# Patient Record
Sex: Male | Born: 1965 | Race: Black or African American | Hispanic: No | Marital: Married | Smoking: Never smoker
Health system: Southern US, Community
[De-identification: ages and names within clinical notes are randomized; demographics above are authoritative.]

## PROBLEM LIST (undated history)

## (undated) DIAGNOSIS — D709 Neutropenia, unspecified: Secondary | ICD-10-CM

## (undated) DIAGNOSIS — D473 Essential (hemorrhagic) thrombocythemia: Secondary | ICD-10-CM

## (undated) DIAGNOSIS — R768 Other specified abnormal immunological findings in serum: Secondary | ICD-10-CM

## (undated) DIAGNOSIS — I10 Essential (primary) hypertension: Secondary | ICD-10-CM

## (undated) DIAGNOSIS — K219 Gastro-esophageal reflux disease without esophagitis: Secondary | ICD-10-CM

## (undated) DIAGNOSIS — D509 Iron deficiency anemia, unspecified: Secondary | ICD-10-CM

## (undated) HISTORY — DX: Neutropenia, unspecified: D70.9

## (undated) HISTORY — DX: Other specified abnormal immunological findings in serum: R76.8

## (undated) HISTORY — DX: Iron deficiency anemia, unspecified: D50.9

## (undated) HISTORY — PX: HIATAL HERNIA REPAIR: SHX195

## (undated) HISTORY — DX: Essential (hemorrhagic) thrombocythemia: D47.3

---
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
## 2003-01-20 ENCOUNTER — Encounter: Payer: Self-pay | Admitting: Specialist

## 2003-01-20 ENCOUNTER — Encounter: Admission: RE | Admit: 2003-01-20 | Discharge: 2003-01-20 | Payer: Self-pay | Admitting: Specialist

## 2003-07-17 ENCOUNTER — Encounter: Admission: RE | Admit: 2003-07-17 | Discharge: 2003-07-17 | Payer: Self-pay | Admitting: Specialist

## 2003-07-17 ENCOUNTER — Encounter: Payer: Self-pay | Admitting: Specialist

## 2004-07-25 ENCOUNTER — Encounter: Admission: RE | Admit: 2004-07-25 | Discharge: 2004-07-25 | Payer: Self-pay | Admitting: Specialist

## 2004-08-31 ENCOUNTER — Ambulatory Visit: Payer: Self-pay | Admitting: Internal Medicine

## 2004-10-01 ENCOUNTER — Ambulatory Visit: Payer: Self-pay | Admitting: Internal Medicine

## 2004-10-31 ENCOUNTER — Ambulatory Visit: Payer: Self-pay | Admitting: Internal Medicine

## 2004-12-01 ENCOUNTER — Ambulatory Visit: Payer: Self-pay | Admitting: Internal Medicine

## 2005-01-01 ENCOUNTER — Ambulatory Visit: Payer: Self-pay | Admitting: Internal Medicine

## 2005-01-29 ENCOUNTER — Ambulatory Visit: Payer: Self-pay | Admitting: Internal Medicine

## 2005-03-01 ENCOUNTER — Ambulatory Visit: Payer: Self-pay | Admitting: Internal Medicine

## 2005-03-31 ENCOUNTER — Ambulatory Visit: Payer: Self-pay | Admitting: Internal Medicine

## 2005-07-02 ENCOUNTER — Ambulatory Visit: Payer: Self-pay | Admitting: Internal Medicine

## 2005-08-01 ENCOUNTER — Ambulatory Visit: Payer: Self-pay | Admitting: Internal Medicine

## 2005-09-19 ENCOUNTER — Ambulatory Visit: Payer: Self-pay | Admitting: Internal Medicine

## 2005-10-01 ENCOUNTER — Ambulatory Visit: Payer: Self-pay | Admitting: Internal Medicine

## 2005-12-18 ENCOUNTER — Ambulatory Visit: Payer: Self-pay | Admitting: Internal Medicine

## 2006-01-01 ENCOUNTER — Ambulatory Visit: Payer: Self-pay | Admitting: Internal Medicine

## 2006-01-29 ENCOUNTER — Ambulatory Visit: Payer: Self-pay | Admitting: Internal Medicine

## 2006-03-01 ENCOUNTER — Ambulatory Visit: Payer: Self-pay | Admitting: Internal Medicine

## 2006-03-31 ENCOUNTER — Ambulatory Visit: Payer: Self-pay | Admitting: Internal Medicine

## 2006-07-01 ENCOUNTER — Ambulatory Visit: Payer: Self-pay | Admitting: Internal Medicine

## 2006-08-01 ENCOUNTER — Ambulatory Visit: Payer: Self-pay | Admitting: Internal Medicine

## 2006-11-26 ENCOUNTER — Ambulatory Visit: Payer: Self-pay | Admitting: Internal Medicine

## 2006-12-01 ENCOUNTER — Ambulatory Visit: Payer: Self-pay | Admitting: Internal Medicine

## 2007-01-21 ENCOUNTER — Ambulatory Visit: Payer: Self-pay | Admitting: Internal Medicine

## 2007-01-30 ENCOUNTER — Ambulatory Visit: Payer: Self-pay | Admitting: Internal Medicine

## 2007-04-16 ENCOUNTER — Ambulatory Visit: Payer: Self-pay | Admitting: Internal Medicine

## 2007-05-02 ENCOUNTER — Ambulatory Visit: Payer: Self-pay | Admitting: Internal Medicine

## 2007-06-01 ENCOUNTER — Ambulatory Visit: Payer: Self-pay | Admitting: Internal Medicine

## 2007-07-02 ENCOUNTER — Ambulatory Visit: Payer: Self-pay | Admitting: Internal Medicine

## 2007-07-27 ENCOUNTER — Ambulatory Visit: Payer: Self-pay | Admitting: Internal Medicine

## 2007-08-02 ENCOUNTER — Ambulatory Visit: Payer: Self-pay | Admitting: Internal Medicine

## 2007-09-01 ENCOUNTER — Ambulatory Visit: Payer: Self-pay | Admitting: Internal Medicine

## 2007-09-09 ENCOUNTER — Ambulatory Visit: Payer: Self-pay | Admitting: Internal Medicine

## 2007-10-02 ENCOUNTER — Ambulatory Visit: Payer: Self-pay | Admitting: Internal Medicine

## 2007-11-01 ENCOUNTER — Ambulatory Visit: Payer: Self-pay | Admitting: Internal Medicine

## 2007-11-01 HISTORY — PX: UPPER GI ENDOSCOPY: SHX6162

## 2007-11-24 ENCOUNTER — Ambulatory Visit: Payer: Self-pay | Admitting: Gastroenterology

## 2008-01-11 ENCOUNTER — Ambulatory Visit: Payer: Self-pay | Admitting: Internal Medicine

## 2008-01-30 ENCOUNTER — Ambulatory Visit: Payer: Self-pay | Admitting: Internal Medicine

## 2008-03-01 ENCOUNTER — Ambulatory Visit: Payer: Self-pay | Admitting: Internal Medicine

## 2008-04-28 ENCOUNTER — Ambulatory Visit: Payer: Self-pay | Admitting: Internal Medicine

## 2008-05-01 ENCOUNTER — Ambulatory Visit: Payer: Self-pay | Admitting: Internal Medicine

## 2008-07-01 ENCOUNTER — Ambulatory Visit: Payer: Self-pay | Admitting: Internal Medicine

## 2008-08-31 ENCOUNTER — Ambulatory Visit: Payer: Self-pay | Admitting: Internal Medicine

## 2008-09-01 ENCOUNTER — Ambulatory Visit: Payer: Self-pay | Admitting: Internal Medicine

## 2008-10-01 ENCOUNTER — Ambulatory Visit: Payer: Self-pay | Admitting: Internal Medicine

## 2008-10-31 ENCOUNTER — Ambulatory Visit: Payer: Self-pay | Admitting: Internal Medicine

## 2008-12-01 ENCOUNTER — Ambulatory Visit: Payer: Self-pay | Admitting: Internal Medicine

## 2009-01-18 ENCOUNTER — Ambulatory Visit: Payer: Self-pay | Admitting: Internal Medicine

## 2009-02-01 ENCOUNTER — Ambulatory Visit: Payer: Self-pay | Admitting: Internal Medicine

## 2009-03-01 ENCOUNTER — Ambulatory Visit: Payer: Self-pay | Admitting: Internal Medicine

## 2009-03-31 ENCOUNTER — Ambulatory Visit: Payer: Self-pay | Admitting: Internal Medicine

## 2009-05-01 ENCOUNTER — Ambulatory Visit: Payer: Self-pay | Admitting: Internal Medicine

## 2009-06-07 ENCOUNTER — Ambulatory Visit: Payer: Self-pay | Admitting: Internal Medicine

## 2009-07-01 ENCOUNTER — Ambulatory Visit: Payer: Self-pay | Admitting: Internal Medicine

## 2009-08-31 ENCOUNTER — Ambulatory Visit: Payer: Self-pay | Admitting: Internal Medicine

## 2009-10-18 ENCOUNTER — Ambulatory Visit: Payer: Self-pay | Admitting: Internal Medicine

## 2009-10-31 ENCOUNTER — Ambulatory Visit: Payer: Self-pay | Admitting: Internal Medicine

## 2009-12-01 ENCOUNTER — Ambulatory Visit: Payer: Self-pay | Admitting: Internal Medicine

## 2009-12-06 ENCOUNTER — Ambulatory Visit: Payer: Self-pay | Admitting: Internal Medicine

## 2010-01-01 ENCOUNTER — Ambulatory Visit: Payer: Self-pay | Admitting: Internal Medicine

## 2010-01-18 ENCOUNTER — Ambulatory Visit: Payer: Self-pay | Admitting: Internal Medicine

## 2010-01-29 ENCOUNTER — Ambulatory Visit: Payer: Self-pay | Admitting: Internal Medicine

## 2010-03-01 ENCOUNTER — Ambulatory Visit: Payer: Self-pay | Admitting: Internal Medicine

## 2010-07-22 ENCOUNTER — Ambulatory Visit: Payer: Self-pay | Admitting: Internal Medicine

## 2010-08-01 ENCOUNTER — Ambulatory Visit: Payer: Self-pay | Admitting: Internal Medicine

## 2010-08-31 ENCOUNTER — Ambulatory Visit: Payer: Self-pay | Admitting: Internal Medicine

## 2010-10-18 ENCOUNTER — Ambulatory Visit: Payer: Self-pay | Admitting: Internal Medicine

## 2010-10-31 ENCOUNTER — Ambulatory Visit: Payer: Self-pay | Admitting: Internal Medicine

## 2010-12-26 ENCOUNTER — Ambulatory Visit: Payer: Self-pay | Admitting: Internal Medicine

## 2011-01-01 ENCOUNTER — Ambulatory Visit: Payer: Self-pay | Admitting: Internal Medicine

## 2011-01-30 ENCOUNTER — Ambulatory Visit: Payer: Self-pay | Admitting: Internal Medicine

## 2011-06-18 ENCOUNTER — Ambulatory Visit: Payer: Self-pay | Admitting: Internal Medicine

## 2011-07-02 ENCOUNTER — Ambulatory Visit: Payer: Self-pay | Admitting: Internal Medicine

## 2011-12-22 ENCOUNTER — Ambulatory Visit: Payer: Self-pay | Admitting: Internal Medicine

## 2011-12-22 LAB — CBC CANCER CENTER
Basophil %: 1.3 %
Eosinophil #: 0.2 x10 3/mm (ref 0.0–0.7)
Eosinophil %: 5.6 %
HCT: 43.2 % (ref 40.0–52.0)
HGB: 14.3 g/dL (ref 13.0–18.0)
Lymphocyte #: 1.4 x10 3/mm (ref 1.0–3.6)
Lymphocyte %: 33.3 %
MCH: 27.2 pg (ref 26.0–34.0)
MCV: 82 fL (ref 80–100)
Monocyte %: 9.9 %
Neutrophil #: 2.1 x10 3/mm (ref 1.4–6.5)
Neutrophil %: 49.9 %
Platelet: 449 x10 3/mm — ABNORMAL HIGH (ref 150–440)
RBC: 5.25 10*6/uL (ref 4.40–5.90)
RDW: 15.3 % — ABNORMAL HIGH (ref 11.5–14.5)
WBC: 4.2 x10 3/mm (ref 3.8–10.6)

## 2012-01-02 ENCOUNTER — Ambulatory Visit: Payer: Self-pay | Admitting: Internal Medicine

## 2012-03-29 ENCOUNTER — Ambulatory Visit: Payer: Self-pay | Admitting: Internal Medicine

## 2012-03-29 LAB — CBC CANCER CENTER
Basophil #: 0.1 x10 3/mm (ref 0.0–0.1)
Basophil %: 1.3 %
Eosinophil #: 0.2 x10 3/mm (ref 0.0–0.7)
HCT: 43.1 % (ref 40.0–52.0)
HGB: 13.7 g/dL (ref 13.0–18.0)
Lymphocyte %: 36.5 %
MCH: 26.7 pg (ref 26.0–34.0)
MCHC: 31.7 g/dL — ABNORMAL LOW (ref 32.0–36.0)
MCV: 84 fL (ref 80–100)
Monocyte #: 0.5 x10 3/mm (ref 0.2–1.0)
Monocyte %: 12.6 %
Neutrophil #: 1.8 x10 3/mm (ref 1.4–6.5)
Neutrophil %: 44.8 %
RBC: 5.12 10*6/uL (ref 4.40–5.90)
RDW: 15.3 % — ABNORMAL HIGH (ref 11.5–14.5)
WBC: 4 x10 3/mm (ref 3.8–10.6)

## 2012-03-31 ENCOUNTER — Ambulatory Visit: Payer: Self-pay | Admitting: Internal Medicine

## 2012-09-21 ENCOUNTER — Ambulatory Visit: Payer: Self-pay | Admitting: Internal Medicine

## 2012-09-21 LAB — CBC CANCER CENTER
Basophil #: 0 x10 3/mm (ref 0.0–0.1)
Basophil %: 0.1 %
Eosinophil #: 0.2 x10 3/mm (ref 0.0–0.7)
Eosinophil %: 4.8 %
HCT: 42.9 % (ref 40.0–52.0)
Lymphocyte #: 1.5 x10 3/mm (ref 1.0–3.6)
Lymphocyte %: 39.6 %
MCH: 27.6 pg (ref 26.0–34.0)
MCHC: 32.5 g/dL (ref 32.0–36.0)
MCV: 85 fL (ref 80–100)
Monocyte #: 0.5 x10 3/mm (ref 0.2–1.0)
Monocyte %: 13.5 %
Neutrophil #: 1.6 x10 3/mm (ref 1.4–6.5)
Neutrophil %: 42 %
Platelet: 329 x10 3/mm (ref 150–440)
RBC: 5.04 10*6/uL (ref 4.40–5.90)
RDW: 14.8 % — ABNORMAL HIGH (ref 11.5–14.5)
WBC: 3.9 x10 3/mm (ref 3.8–10.6)

## 2012-10-01 ENCOUNTER — Ambulatory Visit: Payer: Self-pay | Admitting: Internal Medicine

## 2012-10-25 LAB — CBC CANCER CENTER
Basophil #: 0 x10 3/mm (ref 0.0–0.1)
Basophil %: 0.5 %
Eosinophil #: 0.1 x10 3/mm (ref 0.0–0.7)
Eosinophil %: 3.1 %
HCT: 44.9 % (ref 40.0–52.0)
HGB: 14 g/dL (ref 13.0–18.0)
Lymphocyte #: 1.5 x10 3/mm (ref 1.0–3.6)
Lymphocyte %: 30.9 %
MCH: 26.5 pg (ref 26.0–34.0)
MCV: 85 fL (ref 80–100)
Monocyte #: 0.6 x10 3/mm (ref 0.2–1.0)
Monocyte %: 11.5 %
Neutrophil #: 2.6 x10 3/mm (ref 1.4–6.5)
Neutrophil %: 54 %
Platelet: 346 x10 3/mm (ref 150–440)
RBC: 5.29 10*6/uL (ref 4.40–5.90)
RDW: 14.8 % — ABNORMAL HIGH (ref 11.5–14.5)
WBC: 4.8 x10 3/mm (ref 3.8–10.6)

## 2012-10-31 ENCOUNTER — Ambulatory Visit: Payer: Self-pay | Admitting: Internal Medicine

## 2012-12-01 ENCOUNTER — Ambulatory Visit: Payer: Self-pay | Admitting: Internal Medicine

## 2013-03-17 ENCOUNTER — Ambulatory Visit: Payer: Self-pay | Admitting: Internal Medicine

## 2013-03-24 LAB — CBC CANCER CENTER
Basophil #: 0 x10 3/mm (ref 0.0–0.1)
Basophil %: 0.2 %
Eosinophil #: 0.2 x10 3/mm (ref 0.0–0.7)
Eosinophil %: 4.7 %
HCT: 42.4 % (ref 40.0–52.0)
HGB: 13.7 g/dL (ref 13.0–18.0)
Lymphocyte #: 1.4 x10 3/mm (ref 1.0–3.6)
Lymphocyte %: 38.6 %
MCH: 26.5 pg (ref 26.0–34.0)
MCHC: 32.3 g/dL (ref 32.0–36.0)
MCV: 82 fL (ref 80–100)
Monocyte #: 0.5 x10 3/mm (ref 0.2–1.0)
Neutrophil #: 1.6 x10 3/mm (ref 1.4–6.5)
Neutrophil %: 43.3 %
Platelet: 400 x10 3/mm (ref 150–440)
RBC: 5.16 10*6/uL (ref 4.40–5.90)
WBC: 3.6 x10 3/mm — ABNORMAL LOW (ref 3.8–10.6)

## 2013-03-31 ENCOUNTER — Ambulatory Visit: Payer: Self-pay | Admitting: Internal Medicine

## 2013-03-31 HISTORY — PX: COLONOSCOPY: SHX174

## 2013-04-01 ENCOUNTER — Ambulatory Visit: Payer: Self-pay | Admitting: Gastroenterology

## 2013-04-05 LAB — CBC CANCER CENTER
Basophil #: 0.1 x10 3/mm (ref 0.0–0.1)
Eosinophil %: 3.3 %
HCT: 38.4 % — ABNORMAL LOW (ref 40.0–52.0)
Lymphocyte #: 1.7 x10 3/mm (ref 1.0–3.6)
MCH: 26.5 pg (ref 26.0–34.0)
Monocyte %: 10.1 %
Neutrophil #: 2.7 x10 3/mm (ref 1.4–6.5)
Platelet: 630 x10 3/mm — ABNORMAL HIGH (ref 150–440)
RBC: 4.72 10*6/uL (ref 4.40–5.90)
WBC: 5.1 x10 3/mm (ref 3.8–10.6)

## 2013-05-01 ENCOUNTER — Ambulatory Visit: Payer: Self-pay | Admitting: Internal Medicine

## 2013-06-16 ENCOUNTER — Ambulatory Visit: Payer: Self-pay | Admitting: Internal Medicine

## 2014-01-12 ENCOUNTER — Ambulatory Visit: Payer: Self-pay | Admitting: Internal Medicine

## 2014-01-13 LAB — CBC CANCER CENTER
BASOS ABS: 0.1 x10 3/mm (ref 0.0–0.1)
BASOS PCT: 1.5 %
Eosinophil #: 0.2 x10 3/mm (ref 0.0–0.7)
Eosinophil %: 4.9 %
HCT: 42 % (ref 40.0–52.0)
HGB: 13.3 g/dL (ref 13.0–18.0)
LYMPHS ABS: 1.6 x10 3/mm (ref 1.0–3.6)
LYMPHS PCT: 33 %
MCH: 26.2 pg (ref 26.0–34.0)
MCHC: 31.7 g/dL — ABNORMAL LOW (ref 32.0–36.0)
MCV: 83 fL (ref 80–100)
Monocyte #: 0.5 x10 3/mm (ref 0.2–1.0)
Monocyte %: 10.9 %
NEUTROS ABS: 2.5 x10 3/mm (ref 1.4–6.5)
Neutrophil %: 49.7 %
Platelet: 306 x10 3/mm (ref 150–440)
RBC: 5.1 10*6/uL (ref 4.40–5.90)
RDW: 14.9 % — ABNORMAL HIGH (ref 11.5–14.5)
WBC: 5 x10 3/mm (ref 3.8–10.6)

## 2014-01-29 ENCOUNTER — Ambulatory Visit: Payer: Self-pay | Admitting: Internal Medicine

## 2014-04-12 ENCOUNTER — Ambulatory Visit: Payer: Self-pay | Admitting: Internal Medicine

## 2014-04-12 LAB — CBC CANCER CENTER
BASOS PCT: 2.1 %
Basophil #: 0.1 x10 3/mm (ref 0.0–0.1)
EOS ABS: 0.3 x10 3/mm (ref 0.0–0.7)
Eosinophil %: 6.3 %
HCT: 41.3 % (ref 40.0–52.0)
HGB: 13.4 g/dL (ref 13.0–18.0)
LYMPHS ABS: 1.6 x10 3/mm (ref 1.0–3.6)
Lymphocyte %: 34.5 %
MCH: 26.8 pg (ref 26.0–34.0)
MCHC: 32.6 g/dL (ref 32.0–36.0)
MCV: 82 fL (ref 80–100)
MONOS PCT: 13.6 %
Monocyte #: 0.6 x10 3/mm (ref 0.2–1.0)
NEUTROS ABS: 2 x10 3/mm (ref 1.4–6.5)
NEUTROS PCT: 43.5 %
PLATELETS: 236 x10 3/mm (ref 150–440)
RBC: 5.02 10*6/uL (ref 4.40–5.90)
RDW: 14.8 % — ABNORMAL HIGH (ref 11.5–14.5)
WBC: 4.6 x10 3/mm (ref 3.8–10.6)

## 2014-05-01 ENCOUNTER — Ambulatory Visit: Payer: Self-pay | Admitting: Internal Medicine

## 2014-10-13 ENCOUNTER — Ambulatory Visit: Payer: Self-pay | Admitting: Internal Medicine

## 2014-10-13 LAB — CBC CANCER CENTER
BASOS ABS: 0 x10 3/mm (ref 0.0–0.1)
BASOS PCT: 0.5 %
EOS ABS: 0.2 x10 3/mm (ref 0.0–0.7)
Eosinophil %: 4.3 %
HCT: 39.4 % — ABNORMAL LOW (ref 40.0–52.0)
HGB: 12.6 g/dL — ABNORMAL LOW (ref 13.0–18.0)
LYMPHS ABS: 1.8 x10 3/mm (ref 1.0–3.6)
LYMPHS PCT: 41.3 %
MCH: 26.9 pg (ref 26.0–34.0)
MCHC: 32.1 g/dL (ref 32.0–36.0)
MCV: 84 fL (ref 80–100)
Monocyte #: 0.5 x10 3/mm (ref 0.2–1.0)
Monocyte %: 11 %
NEUTROS PCT: 42.9 %
Neutrophil #: 1.8 x10 3/mm (ref 1.4–6.5)
Platelet: 374 x10 3/mm (ref 150–440)
RBC: 4.69 10*6/uL (ref 4.40–5.90)
RDW: 14.3 % (ref 11.5–14.5)
WBC: 4.3 x10 3/mm (ref 3.8–10.6)

## 2014-10-31 ENCOUNTER — Ambulatory Visit: Payer: Self-pay | Admitting: Internal Medicine

## 2014-11-01 LAB — RETICULOCYTES
Absolute Retic Count: 0.0428 10*6/uL (ref 0.019–0.186)
RETICULOCYTE: 0.83 % (ref 0.4–3.1)

## 2014-11-01 LAB — CBC CANCER CENTER
BASOS ABS: 0.1 x10 3/mm (ref 0.0–0.1)
BASOS PCT: 1.3 %
EOS PCT: 4.1 %
Eosinophil #: 0.2 x10 3/mm (ref 0.0–0.7)
HCT: 42.7 % (ref 40.0–52.0)
HGB: 13.5 g/dL (ref 13.0–18.0)
Lymphocyte #: 1.7 x10 3/mm (ref 1.0–3.6)
Lymphocyte %: 37.2 %
MCH: 26.5 pg (ref 26.0–34.0)
MCHC: 31.5 g/dL — AB (ref 32.0–36.0)
MCV: 84 fL (ref 80–100)
MONOS PCT: 9.7 %
Monocyte #: 0.4 x10 3/mm (ref 0.2–1.0)
NEUTROS PCT: 47.7 %
Neutrophil #: 2.1 x10 3/mm (ref 1.4–6.5)
Platelet: 328 x10 3/mm (ref 150–440)
RBC: 5.09 10*6/uL (ref 4.40–5.90)
RDW: 14.7 % — AB (ref 11.5–14.5)
WBC: 4.5 x10 3/mm (ref 3.8–10.6)

## 2014-11-01 LAB — CREATININE, SERUM
Creatinine: 0.93 mg/dL (ref 0.60–1.30)
EGFR (African American): >60
EGFR (Non-African Amer.): >60

## 2014-11-01 LAB — IRON AND TIBC
Iron Bind.Cap.(Total): 330 ug/dL (ref 250–450)
Iron Saturation: 21 %
Iron: 70 ug/dL (ref 65–175)
Unbound Iron-Bind.Cap.: 260 ug/dL

## 2014-11-01 LAB — FERRITIN: Ferritin (ARMC): 44 ng/mL (ref 8–388)

## 2014-12-01 ENCOUNTER — Ambulatory Visit: Payer: Self-pay | Admitting: Internal Medicine

## 2015-01-31 ENCOUNTER — Ambulatory Visit
Admit: 2015-01-31 | Disposition: A | Discharge status: Home / Self Care | Payer: Self-pay | Attending: Internal Medicine | Admitting: Internal Medicine

## 2015-03-02 ENCOUNTER — Ambulatory Visit
Admit: 2015-03-02 | Disposition: A | Discharge status: Home / Self Care | Payer: Self-pay | Attending: Internal Medicine | Admitting: Internal Medicine

## 2015-04-24 ENCOUNTER — Other Ambulatory Visit: Payer: Self-pay | Admitting: *Deleted

## 2015-04-24 DIAGNOSIS — D473 Essential (hemorrhagic) thrombocythemia: Secondary | ICD-10-CM

## 2015-04-25 ENCOUNTER — Other Ambulatory Visit: Payer: Self-pay

## 2015-05-08 ENCOUNTER — Inpatient Hospital Stay: Payer: 59 | Attending: Family Medicine

## 2015-05-08 ENCOUNTER — Encounter (INDEPENDENT_AMBULATORY_CARE_PROVIDER_SITE_OTHER): Payer: Self-pay

## 2015-05-08 DIAGNOSIS — D473 Essential (hemorrhagic) thrombocythemia: Secondary | ICD-10-CM | POA: Diagnosis not present

## 2015-05-08 LAB — CBC WITH DIFFERENTIAL/PLATELET
Basophils Absolute: 0.1 10*3/uL (ref 0–0.1)
Basophils Relative: 1 %
EOS ABS: 0.2 10*3/uL (ref 0–0.7)
Eosinophils Relative: 5 %
HCT: 41.9 % (ref 40.0–52.0)
HEMOGLOBIN: 13.5 g/dL (ref 13.0–18.0)
Lymphocytes Relative: 32 %
Lymphs Abs: 1.5 10*3/uL (ref 1.0–3.6)
MCH: 26.5 pg (ref 26.0–34.0)
MCHC: 32.3 g/dL (ref 32.0–36.0)
MCV: 82.1 fL (ref 80.0–100.0)
Monocytes Absolute: 0.6 10*3/uL (ref 0.2–1.0)
Monocytes Relative: 12 %
NEUTROS ABS: 2.3 10*3/uL (ref 1.4–6.5)
Neutrophils Relative %: 50 %
Platelets: 573 10*3/uL — ABNORMAL HIGH (ref 150–440)
RBC: 5.11 MIL/uL (ref 4.40–5.90)
RDW: 15 % — ABNORMAL HIGH (ref 11.5–14.5)
WBC: 4.6 10*3/uL (ref 3.8–10.6)

## 2015-07-18 ENCOUNTER — Inpatient Hospital Stay: Payer: 59

## 2015-07-18 ENCOUNTER — Inpatient Hospital Stay: Payer: 59 | Attending: Internal Medicine

## 2015-07-18 DIAGNOSIS — D473 Essential (hemorrhagic) thrombocythemia: Secondary | ICD-10-CM

## 2015-07-18 LAB — CBC WITH DIFFERENTIAL/PLATELET
Basophils Absolute: 0.3 10*3/uL — ABNORMAL HIGH (ref 0–0.1)
Basophils Relative: 6 %
Eosinophils Absolute: 0.2 10*3/uL (ref 0–0.7)
Eosinophils Relative: 4 %
HEMATOCRIT: 41.7 % (ref 40.0–52.0)
HEMOGLOBIN: 13.6 g/dL (ref 13.0–18.0)
LYMPHS PCT: 28 %
Lymphs Abs: 1.3 10*3/uL (ref 1.0–3.6)
MCH: 26.8 pg (ref 26.0–34.0)
MCHC: 32.5 g/dL (ref 32.0–36.0)
MCV: 82.3 fL (ref 80.0–100.0)
MONO ABS: 0.6 10*3/uL (ref 0.2–1.0)
MONOS PCT: 13 %
NEUTROS ABS: 2.3 10*3/uL (ref 1.4–6.5)
NEUTROS PCT: 49 %
Platelets: 351 10*3/uL (ref 150–440)
RBC: 5.07 MIL/uL (ref 4.40–5.90)
RDW: 15.3 % — AB (ref 11.5–14.5)
WBC: 4.6 10*3/uL (ref 3.8–10.6)

## 2015-09-20 ENCOUNTER — Other Ambulatory Visit: Payer: Self-pay | Admitting: *Deleted

## 2015-09-20 MED ORDER — ANAGRELIDE HCL 0.5 MG PO CAPS: ORAL_CAPSULE | ORAL | Status: DC

## 2015-09-20 NOTE — Telephone Encounter (Signed)
He was last seen by Dr Inez Pilgrim 11/01/2014 and has an appt with Magda Paganini on 11/9, but needs a refill on his agrylin

## 2015-09-20 NOTE — Telephone Encounter (Signed)
Spoke with patient regarding refill being sent to cover him until his appt on the 9th and that he would be seeing a new md. He was fine with that and asked why only enough til appt and I told him may want to make some adjustments

## 2015-10-04 ENCOUNTER — Encounter: Payer: Self-pay | Admitting: *Deleted

## 2015-10-04 ENCOUNTER — Telehealth: Payer: Self-pay | Admitting: *Deleted

## 2015-10-04 NOTE — Telephone Encounter (Signed)
Wants to get his labs drawn at [facility] and will need an order form for that. He has LAB / MD appt on 11/9 and said he plans on coming ot see md at that time, so he needs form before then to get labs drawn. Please call him when for m ready to pick up

## 2015-10-05 ENCOUNTER — Encounter: Payer: Self-pay | Admitting: Internal Medicine

## 2015-10-05 NOTE — Telephone Encounter (Signed)
Patient notified 11/3. rx for labs ready to be picked up.

## 2015-10-10 ENCOUNTER — Inpatient Hospital Stay: Payer: 59 | Attending: Internal Medicine | Admitting: Internal Medicine

## 2015-10-10 ENCOUNTER — Encounter: Payer: Self-pay | Admitting: Internal Medicine

## 2015-10-10 ENCOUNTER — Other Ambulatory Visit: Payer: Self-pay

## 2015-10-10 VITALS — BP 140/90 | HR 71 | Temp 98.4°F | Resp 18 | Ht 66.0 in | Wt 163.1 lb

## 2015-10-10 DIAGNOSIS — Z79899 Other long term (current) drug therapy: Secondary | ICD-10-CM

## 2015-10-10 DIAGNOSIS — D473 Essential (hemorrhagic) thrombocythemia: Secondary | ICD-10-CM | POA: Diagnosis not present

## 2015-10-10 DIAGNOSIS — D709 Neutropenia, unspecified: Secondary | ICD-10-CM | POA: Insufficient documentation

## 2015-10-10 DIAGNOSIS — D509 Iron deficiency anemia, unspecified: Secondary | ICD-10-CM | POA: Diagnosis not present

## 2015-10-10 DIAGNOSIS — Z7982 Long term (current) use of aspirin: Secondary | ICD-10-CM | POA: Diagnosis not present

## 2015-10-10 NOTE — Progress Notes (Signed)
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 I reviewed the patient's prior chart/pertinent labs/imaging in detail; findings are summarized.  A very pleasant 49 year old African-American male patient with a history of essential thrombocytosis currently on anagrelide/aspirin is here for follow-up. Patient denies any history of strokes or DVT or any other thromboembolic events. He denies any pain in his hands and feet. He denies any weight loss night sweats or any fevers.  He admits to missing few doses of his medications.  REVIEW OF SYSTEMS:  A complete 10 point review of system is done which is negative except mentioned above/history of present illness.   PAST MEDICAL HISTORY :  Past Medical History  Diagnosis Date  . Essential thrombocytosis ([place])   . IDA (iron deficiency anemia)   . Helicobacter pylori ab+     resolved  . Neutropenia ([place])     PAST SURGICAL HISTORY :   Past Surgical History  Procedure Laterality Date  . Hiatal hernia repair    . Colonoscopy  03/2013  . Upper gi endoscopy  11/2007    FAMILY HISTORY :   Family History  Problem Relation Age of Onset  . Kidney cancer Father 7    ? liver cancer  . Colon cancer Mother 24    SOCIAL HISTORY:   Social History  Substance Use Topics  . Smoking status: Never Smoker   . Smokeless tobacco: Never Used  . Alcohol Use: No    ALLERGIES:  has No Known Allergies.  MEDICATIONS:  Current Outpatient Prescriptions  Medication Sig Dispense Refill  . anagrelide (AGRYLIN) 0.5 MG capsule Take 5 capsules daily or as directed by physician 110 capsule 0  . aspirin EC 81 MG tablet Take 1 tablet by  mouth daily.    . fexofenadine (ALLEGRA) 180 MG tablet Take 1 tablet by mouth daily.    . hydrocortisone 2.5 % cream Apply 1 application topically 3 (three) times a week.  6  . omeprazole (PRILOSEC) 40 MG capsule Take 40 mg by mouth daily.  3   No current facility-administered medications for this visit.    PHYSICAL EXAMINATION: ECOG PERFORMANCE STATUS: 0 - Asymptomatic  BP 140/90 mmHg  Pulse 71  Temp(Src) 98.4 F (36.9 C)  Resp 18  Ht 5\' 6"  (1.676 m)  Wt 163 lb 2.3 oz (74 kg)  BMI 26.34 kg/m2  Filed Weights   10/10/15 1531  Weight: 163 lb 2.3 oz (74 kg)    GENERAL: Well-nourished well-developed; Alert, no distress and comfortable.   Alone. EYES: no pallor or icterus OROPHARYNX: no thrush or ulceration; good dentition  NECK: supple, no masses felt LYMPH:  no palpable lymphadenopathy in the cervical, axillary or inguinal regions LUNGS: clear to auscultation and  No wheeze or crackles HEART/CVS: regular rate & rhythm and no murmurs; No lower extremity edema ABDOMEN:abdomen soft, non-tender and normal bowel sounds Musculoskeletal:no cyanosis of digits and no clubbing  PSYCH: alert & oriented x 3 with fluent speech NEURO: no focal motor/sensory deficits SKIN:  no rashes or significant lesions  LABORATORY DATA:  I have reviewed the data as listed    Component Value Date/Time   CREATININE 0.93 11/01/2014 1538  GFRNONAA >60 11/01/2014 1538   GFRAA >60 11/01/2014 1538    No results found for: SPEP, UPEP  Lab Results  Component Value Date   WBC 4.6 07/18/2015   NEUTROABS 2.3 07/18/2015   HGB 13.6 07/18/2015   HCT 41.7 07/18/2015   MCV 82.3 07/18/2015   PLT 351 07/18/2015      Chemistry      Component Value Date/Time   CREATININE 0.93 11/01/2014 1538   No results found for: CALCIUM, ALKPHOS, AST, ALT, BILITOT     RADIOGRAPHIC STUDIES: I have personally reviewed the radiological images as listed and agreed with the findings in the report. No results found.    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 Currently on anagrelide 4 mg a day; aspirin 81 mg a day. Patient admits to missing few doses of anagrelide/aspirin intermittently. Today platelet count is 490 [normal 390]; otherwise hemoglobin white count normal.  # If his thrombocytosis continues to be an issue- I would recommend Hydrea. I recommend compliance with his medications. I would not change his medications at this time.  # Follow-up in 3 months with labs. He'll have labs done through [facility].    No orders of the defined types were placed in this encounter.   All questions were answered. The patient knows to call the clinic with any problems, questions or concerns. No barriers to learning was detected.  I spent 25 minutes counseling the patient face to face. The total time spent in the appointment was 30 minutes and more than 50% was on counseling and review of test results     Cammie Sickle, MD 10/10/2015 3:39 PM

## 2015-10-10 NOTE — Progress Notes (Signed)
Patient declines influenza injection.

## 2015-10-22 ENCOUNTER — Other Ambulatory Visit: Payer: Self-pay | Admitting: *Deleted

## 2015-10-22 MED ORDER — ANAGRELIDE HCL 0.5 MG PO CAPS: ORAL_CAPSULE | ORAL | Status: DC

## 2015-11-09 ENCOUNTER — Other Ambulatory Visit: Payer: Self-pay | Admitting: *Deleted

## 2015-11-09 MED ORDER — ANAGRELIDE HCL 0.5 MG PO CAPS: ORAL_CAPSULE | ORAL | Status: DC

## 2015-11-09 NOTE — Telephone Encounter (Signed)
Asking for a 3 month supply of his anagrelide like he used to get. He takes 5 caps (2.5 mg) a day

## 2016-01-08 ENCOUNTER — Encounter: Payer: Self-pay | Admitting: Internal Medicine

## 2016-01-10 ENCOUNTER — Inpatient Hospital Stay: Payer: 59 | Attending: Internal Medicine | Admitting: Internal Medicine

## 2016-01-10 ENCOUNTER — Encounter: Payer: Self-pay | Admitting: Internal Medicine

## 2016-01-10 VITALS — BP 153/91 | HR 74 | Temp 97.5°F | Ht 66.0 in | Wt 158.4 lb

## 2016-01-10 DIAGNOSIS — Z8719 Personal history of other diseases of the digestive system: Secondary | ICD-10-CM | POA: Diagnosis not present

## 2016-01-10 DIAGNOSIS — Z800 Family history of malignant neoplasm of digestive organs: Secondary | ICD-10-CM | POA: Diagnosis not present

## 2016-01-10 DIAGNOSIS — D473 Essential (hemorrhagic) thrombocythemia: Secondary | ICD-10-CM | POA: Diagnosis not present

## 2016-01-10 DIAGNOSIS — D709 Neutropenia, unspecified: Secondary | ICD-10-CM | POA: Diagnosis not present

## 2016-01-10 DIAGNOSIS — Z8051 Family history of malignant neoplasm of kidney: Secondary | ICD-10-CM | POA: Diagnosis not present

## 2016-01-10 DIAGNOSIS — D509 Iron deficiency anemia, unspecified: Secondary | ICD-10-CM | POA: Insufficient documentation

## 2016-01-10 DIAGNOSIS — Z79899 Other long term (current) drug therapy: Secondary | ICD-10-CM | POA: Diagnosis not present

## 2016-01-10 DIAGNOSIS — Z7982 Long term (current) use of aspirin: Secondary | ICD-10-CM | POA: Insufficient documentation

## 2016-01-10 NOTE — Progress Notes (Signed)
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   Patient is looking forward to an occasional [place] beginning of March 2017.  Patient denies any history of strokes or DVT or any other thromboembolic events. He denies any pain in his hands and feet. He denies any weight loss night sweats or any fevers. He admits to be compliant with his medications.  REVIEW OF SYSTEMS:  A complete 10 point review of system is done which is negative except mentioned above/history of present illness.   PAST MEDICAL HISTORY :  Past Medical History  Diagnosis Date  . Essential thrombocytosis ([place])   . IDA (iron deficiency anemia)   . Helicobacter pylori ab+     resolved  . Neutropenia ([place])     PAST SURGICAL HISTORY :   Past Surgical History  Procedure Laterality Date  . Hiatal hernia repair    . Colonoscopy  03/2013  . Upper gi endoscopy  11/2007    FAMILY HISTORY :   Family History  Problem Relation Age of Onset  . Kidney cancer Father 82    ? liver cancer  . Colon cancer Mother 22    SOCIAL HISTORY:   Social History  Substance Use Topics  . Smoking status: Never Smoker   . Smokeless tobacco: Never Used  . Alcohol Use: No    ALLERGIES:  has No Known Allergies.  MEDICATIONS:  Current Outpatient Prescriptions  Medication Sig Dispense Refill  . anagrelide (AGRYLIN) 0.5 MG capsule Take 5 capsules daily or as directed by physician 450 capsule 0  . aspirin EC 81 MG tablet Take 1 tablet by mouth daily.    . fexofenadine (ALLEGRA) 180 MG tablet Take 1 tablet by mouth daily.    . hydrocortisone 2.5  % cream Apply 1 application topically 3 (three) times a week.  6  . omeprazole (PRILOSEC) 40 MG capsule Take 40 mg by mouth daily.  3   No current facility-administered medications for this visit.    PHYSICAL EXAMINATION: ECOG PERFORMANCE STATUS: 0 - Asymptomatic  BP 153/91 mmHg  Pulse 74  Temp(Src) 97.5 F (36.4 C) (Tympanic)  Ht 5\' 6"  (1.676 m)  Wt 158 lb 6.4 oz (71.85 kg)  BMI 25.58 kg/m2  SpO2 98%  Filed Weights   01/10/16 1510  Weight: 158 lb 6.4 oz (71.85 kg)    GENERAL: Well-nourished well-developed; Alert, no distress and comfortable.   Alone. EYES: no pallor or icterus OROPHARYNX: no thrush or ulceration; good dentition  NECK: supple, no masses felt LYMPH:  no palpable lymphadenopathy in the cervical, axillary or inguinal regions LUNGS: clear to auscultation and  No wheeze or crackles HEART/CVS: regular rate & rhythm and no murmurs; No lower extremity edema ABDOMEN:abdomen soft, non-tender and normal bowel sounds Musculoskeletal:no cyanosis of digits and no clubbing  PSYCH: alert & oriented x 3 with fluent speech NEURO: no focal motor/sensory deficits SKIN:  no rashes or significant lesions  LABORATORY DATA:  I have reviewed the data as listed    Component Value Date/Time   CREATININE 0.93 11/01/2014 1538   GFRNONAA >60 11/01/2014 1538   GFRAA >60 11/01/2014 1538  No results found for: SPEP, UPEP  Lab Results  Component Value Date   WBC 4.6 07/18/2015   NEUTROABS 2.3 07/18/2015   HGB 13.6 07/18/2015   HCT 41.7 07/18/2015   MCV 82.3 07/18/2015   PLT 351 07/18/2015      Chemistry      Component Value Date/Time   CREATININE 0.93 11/01/2014 1538   No results found for: CALCIUM, ALKPHOS, AST, ALT, BILITOT      ASSESSMENT & PLAN:   # 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 Currently on anagrelide 4 mg a day; aspirin 81 mg a day. Patient admits to compliance with anagrelide and aspirin. Today platelet count is 412 [normal 380];  otherwise hemoglobin white count normal. Patient tolerating anagrelide very well without any major side effects.  # I reviewed the data from 2 randomized clinical trials that showed slight benefit of Hydrea over anagrelide- in terms of improved efficacy/less toxicity [especially myelofibrosis]. Patient is interested in trying Hydrea; when he comes back from [place].  # Recommend Hydrea 500 mg milligram once every other day while on anagrelide 4 mg. Plan to start this regimen in approximately 6 weeks from now/return from [place]/he will have CBC prior to that. Prescription of Hydrea to be given at that time.  # Follow-up in 3 months with labs. He'll have labs done through [facility].      Cammie Sickle, MD 01/10/2016 3:25 PM

## 2016-01-25 ENCOUNTER — Other Ambulatory Visit: Payer: Self-pay | Admitting: *Deleted

## 2016-01-25 MED ORDER — ANAGRELIDE HCL 0.5 MG PO CAPS: ORAL_CAPSULE | ORAL | Status: DC

## 2016-01-25 NOTE — Telephone Encounter (Signed)
Asking for refill on his Agrylin. Last seen 2/9, next appt 5/11

## 2016-04-09 ENCOUNTER — Ambulatory Visit: Payer: 59 | Admitting: Internal Medicine

## 2016-04-10 ENCOUNTER — Inpatient Hospital Stay: Payer: 59 | Admitting: Internal Medicine

## 2016-04-23 ENCOUNTER — Inpatient Hospital Stay: Payer: 59 | Attending: Internal Medicine | Admitting: Internal Medicine

## 2016-04-23 VITALS — BP 167/85 | HR 69 | Temp 96.9°F | Resp 18 | Wt 158.5 lb

## 2016-04-23 DIAGNOSIS — Z79899 Other long term (current) drug therapy: Secondary | ICD-10-CM

## 2016-04-23 DIAGNOSIS — Z800 Family history of malignant neoplasm of digestive organs: Secondary | ICD-10-CM

## 2016-04-23 DIAGNOSIS — Z8051 Family history of malignant neoplasm of kidney: Secondary | ICD-10-CM

## 2016-04-23 DIAGNOSIS — R030 Elevated blood-pressure reading, without diagnosis of hypertension: Secondary | ICD-10-CM | POA: Diagnosis not present

## 2016-04-23 DIAGNOSIS — Z8619 Personal history of other infectious and parasitic diseases: Secondary | ICD-10-CM | POA: Diagnosis not present

## 2016-04-23 DIAGNOSIS — D509 Iron deficiency anemia, unspecified: Secondary | ICD-10-CM | POA: Diagnosis not present

## 2016-04-23 DIAGNOSIS — Z7982 Long term (current) use of aspirin: Secondary | ICD-10-CM

## 2016-04-23 DIAGNOSIS — D709 Neutropenia, unspecified: Secondary | ICD-10-CM

## 2016-04-23 DIAGNOSIS — D473 Essential (hemorrhagic) thrombocythemia: Secondary | ICD-10-CM

## 2016-04-23 MED ORDER — HYDROXYUREA 500 MG PO CAPS: 500.0000 mg | ORAL_CAPSULE | Freq: Every day | ORAL | Status: DC

## 2016-04-23 NOTE — Progress Notes (Signed)
[place] OFFICE PROGRESS NOTE  Patient Care Team: Kirk Ruths, MD as PCP - General (Internal Medicine)   SUMMARY OF ONCOLOGIC HISTORY:  # 2000- ESSENTIAL THROMBOCYTOSIS [incidental]- Jak 2 [exon ] NEG on Anagrelide 4 mg/d & asprin 81mg /d; June 2017- STart hydrea 500mg /d  INTERVAL HISTORY:  A very pleasant 50 year old African-American male patient with a history of essential thrombocytosis currently on anagrelide/aspirin is here for follow-up.  Patient recently returned from vacation from [place]. Patient denies any history of strokes or DVT or any other thromboembolic events. He denies any pain in his hands and feet.   He also denies any headaches or vision changes shortness of breath or swelling in the legs. He denies any weight loss night sweats or any fevers. He admits to be compliant with his medications.  REVIEW OF SYSTEMS:  A complete 10 point review of system is done which is negative except mentioned above/history of present illness.   PAST MEDICAL HISTORY :  Past Medical History  Diagnosis Date  . Essential thrombocytosis ([place])   . IDA (iron deficiency anemia)   . Helicobacter pylori ab+     resolved  . Neutropenia ([place])     PAST SURGICAL HISTORY :   Past Surgical History  Procedure Laterality Date  . Hiatal hernia repair    . Colonoscopy  03/2013  . Upper gi endoscopy  11/2007    FAMILY HISTORY :   Family History  Problem Relation Age of Onset  . Kidney cancer Father 9    ? liver cancer  . Colon cancer Mother 4    SOCIAL HISTORY:   Social History  Substance Use Topics  . Smoking status: Never Smoker   . Smokeless tobacco: Never Used  . Alcohol Use: No    ALLERGIES:  has No Known Allergies.  MEDICATIONS:  Current Outpatient Prescriptions  Medication Sig Dispense Refill  . anagrelide (AGRYLIN) 0.5 MG capsule Take 5 capsules daily or as directed by physician 450 capsule 1  . aspirin EC 81 MG tablet Take 1 tablet by mouth  daily.    . fexofenadine (ALLEGRA) 180 MG tablet Take 1 tablet by mouth daily.    . hydrocortisone 2.5 % cream Apply 1 application topically 3 (three) times a week.  6  . omeprazole (PRILOSEC) 40 MG capsule Take 40 mg by mouth daily.  3   No current facility-administered medications for this visit.    PHYSICAL EXAMINATION: ECOG PERFORMANCE STATUS: 0 - Asymptomatic  BP 167/85 mmHg  Pulse 69  Temp(Src) 96.9 F (36.1 C) (Tympanic)  Wt 158 lb 8.2 oz (71.9 kg)  SpO2 99%  Filed Weights   04/23/16 0900  Weight: 158 lb 8.2 oz (71.9 kg)    GENERAL: Well-nourished well-developed; Alert, no distress and comfortable.   Alone. EYES: no pallor or icterus OROPHARYNX: no thrush or ulceration; good dentition  NECK: supple, no masses felt LYMPH:  no palpable lymphadenopathy in the cervical, axillary or inguinal regions LUNGS: clear to auscultation and  No wheeze or crackles HEART/CVS: regular rate & rhythm and no murmurs; No lower extremity edema ABDOMEN:abdomen soft, non-tender and normal bowel sounds Musculoskeletal:no cyanosis of digits and no clubbing  PSYCH: alert & oriented x 3 with fluent speech NEURO: no focal motor/sensory deficits SKIN:  no rashes or significant lesions  LABORATORY DATA:  I have reviewed the data as listed    Component Value Date/Time   CREATININE 0.93 11/01/2014 1538   GFRNONAA >60 11/01/2014 1538   GFRAA >  60 11/01/2014 1538    No results found for: SPEP, UPEP  Lab Results  Component Value Date   WBC 4.6 07/18/2015   NEUTROABS 2.3 07/18/2015   HGB 13.6 07/18/2015   HCT 41.7 07/18/2015   MCV 82.3 07/18/2015   PLT 351 07/18/2015      Chemistry      Component Value Date/Time   CREATININE 0.93 11/01/2014 1538   No results found for: CALCIUM, ALKPHOS, AST, ALT, BILITOT      ASSESSMENT & PLAN:   # 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 Currently on anagrelide 4 mg a day; aspirin 81 mg a day. Patient's platelets are 260. Patient  tolerating anagrelide very well without any major side effects-except question elevated blood pressure.   # I reviewed the data from 2 randomized clinical trials that showed slight benefit of Hydrea over anagrelide- in terms of improved efficacy/less toxicity [especially myelofibrosis]. I recommend Hydrea 500 mg once a day. Discussed the potential side effects including issues with LFTs oral sores; skin rash and reproductive potential.  He will finish off anagrelide which is available 1 month and then transition to Hydrea.  # withinElevated Blood pressure- Today 167/97. Patient is asymptomatic. It has been consistently been elevated. Unlikely from anagrelide; however we will watch and see if this improves off anagrelide. However I have asked him to talk to his PCP regarding starting on blood pressure medications.  # Follow-up in 2 months with labs. He'll have labs done through [facility].He will again follow-up with labs in 4 months with me.  # 25 minutes face-to-face with the patient discussing the above plan of care; more than 50% of time spent on prognosis/ natural history; counseling and coordination.       Cammie Sickle, MD 04/23/2016 9:12 AM

## 2016-04-23 NOTE — Progress Notes (Signed)
Patient ambulates without assistance, brought to exam room 5.  BP 167/97 (right arm), O2 99% on room air.  Patient denies pain or discomfort, vitals documented.  Medication record updated, information provided by patient.

## 2016-04-24 ENCOUNTER — Ambulatory Visit: Payer: 59 | Admitting: Internal Medicine

## 2016-06-23 ENCOUNTER — Encounter: Payer: Self-pay | Admitting: Internal Medicine

## 2016-06-26 ENCOUNTER — Other Ambulatory Visit: Payer: Self-pay | Admitting: Internal Medicine

## 2016-06-26 ENCOUNTER — Telehealth: Payer: Self-pay | Admitting: *Deleted

## 2016-06-26 ENCOUNTER — Telehealth: Payer: Self-pay | Admitting: Internal Medicine

## 2016-06-26 NOTE — Telephone Encounter (Signed)
Platelets- 600+- Recommend Hydrea 500mg /day [if not already on]; if already on- then recommend hydrea 500 mg BID. Labs/visit in 2 months from now.   Heather- please inform pt- Thx  Dr.B

## 2016-06-26 NOTE — Telephone Encounter (Signed)
Attempted to return call to patient regarding results and find out his current dose of Hydrea. I left a message for him to return call to office

## 2016-06-26 NOTE — Telephone Encounter (Signed)
Asking for lab results from labs drawn at [facility]

## 2016-06-27 ENCOUNTER — Telehealth: Payer: Self-pay

## 2016-06-27 DIAGNOSIS — D473 Essential (hemorrhagic) thrombocythemia: Secondary | ICD-10-CM

## 2016-06-27 MED ORDER — HYDROXYUREA 500 MG PO CAPS: 500.0000 mg | ORAL_CAPSULE | Freq: Two times a day (BID) | ORAL | 3 refills | Status: DC

## 2016-06-27 NOTE — Telephone Encounter (Signed)
Spoke with the patient regarding platelet count.  Patient currently takes 500 mg daily.  Instructed patient to begin taking Hydroxyurea 500 mg BID per Dr. Tish Men instructions.  Patient stated understanding.

## 2016-07-07 ENCOUNTER — Telehealth: Payer: Self-pay | Admitting: *Deleted

## 2016-07-07 NOTE — Telephone Encounter (Signed)
Order forms completed for [facility] for weekly cbc and monthly CMP. Patient informed to come and pick up forms

## 2016-07-07 NOTE — Telephone Encounter (Signed)
Pt called concerned about waiting 2 months to have labs rechecked after doubling his Hydrea dose on 7/28.  Would like a [facility] order to have it checked weekly until stable

## 2016-07-07 NOTE — Telephone Encounter (Signed)
Cbc weekly and cmp monthly.  Orders written. [facility] req. Ready to be picked up.

## 2016-07-09 ENCOUNTER — Encounter: Payer: Self-pay | Admitting: Internal Medicine

## 2016-07-18 ENCOUNTER — Telehealth: Payer: Self-pay | Admitting: *Deleted

## 2016-07-18 NOTE — Telephone Encounter (Signed)
Notified forms ready for pick up

## 2016-07-23 ENCOUNTER — Other Ambulatory Visit: Payer: Self-pay | Admitting: *Deleted

## 2016-07-23 DIAGNOSIS — D473 Essential (hemorrhagic) thrombocythemia: Secondary | ICD-10-CM

## 2016-07-23 MED ORDER — HYDROXYUREA 500 MG PO CAPS: 500.0000 mg | ORAL_CAPSULE | Freq: Two times a day (BID) | ORAL | 3 refills | Status: DC

## 2016-07-23 NOTE — Addendum Note (Signed)
Addended by: Betti Cruz on: 07/23/2016 11:18 AM   Modules accepted: Orders

## 2016-07-25 ENCOUNTER — Encounter: Payer: Self-pay | Admitting: Internal Medicine

## 2016-07-31 ENCOUNTER — Encounter: Payer: Self-pay | Admitting: Internal Medicine

## 2016-08-12 ENCOUNTER — Other Ambulatory Visit: Payer: Self-pay | Admitting: Internal Medicine

## 2016-08-12 ENCOUNTER — Telehealth: Payer: Self-pay | Admitting: Internal Medicine

## 2016-08-12 ENCOUNTER — Telehealth: Payer: Self-pay

## 2016-08-12 DIAGNOSIS — D473 Essential (hemorrhagic) thrombocythemia: Secondary | ICD-10-CM

## 2016-08-12 MED ORDER — HYDROXYUREA 500 MG PO CAPS: ORAL_CAPSULE | ORAL | 3 refills | Status: DC

## 2016-08-12 NOTE — Telephone Encounter (Signed)
Spoke to pt; will increase the hydrea to 500 in am; 1000 in pm. New script called in. If not helping then will add anagrelide.

## 2016-08-12 NOTE — Telephone Encounter (Signed)
Called pt and reported that per MD to stay on hydrea 500 mg BID.  Per pt he has a bluish color underneath finger nail bed and wants to know if this is related to the Hydrea since it started when he started Hydrea?  Pt would like to also know why was he switched from Agrylin to the [facility] and reported that his platelets are higher now on the Hydrea?  Pt stated his platelets were in 300-400 range on Agrylin.  I went over one result we had that was in the 500's, then pt verbalized it was probably because he was not always taking his medication the way it was supposed to be taken.  I verbalized I would ask Dr. B and inform him of the answers.  No other concerns noted.  Informed pt of his platlets reults 8/9 = 713,  8/25 = 632 & 8/31=631.

## 2016-08-27 ENCOUNTER — Inpatient Hospital Stay: Payer: 59 | Attending: Internal Medicine | Admitting: Internal Medicine

## 2016-08-27 VITALS — BP 152/90 | HR 66 | Temp 96.6°F | Resp 20 | Ht 66.0 in | Wt 158.5 lb

## 2016-08-27 DIAGNOSIS — Z79899 Other long term (current) drug therapy: Secondary | ICD-10-CM | POA: Diagnosis not present

## 2016-08-27 DIAGNOSIS — D509 Iron deficiency anemia, unspecified: Secondary | ICD-10-CM | POA: Insufficient documentation

## 2016-08-27 DIAGNOSIS — D473 Essential (hemorrhagic) thrombocythemia: Secondary | ICD-10-CM | POA: Diagnosis not present

## 2016-08-27 DIAGNOSIS — D709 Neutropenia, unspecified: Secondary | ICD-10-CM | POA: Diagnosis not present

## 2016-08-27 DIAGNOSIS — Z8619 Personal history of other infectious and parasitic diseases: Secondary | ICD-10-CM | POA: Diagnosis not present

## 2016-08-27 DIAGNOSIS — R030 Elevated blood-pressure reading, without diagnosis of hypertension: Secondary | ICD-10-CM | POA: Diagnosis not present

## 2016-08-27 DIAGNOSIS — Z800 Family history of malignant neoplasm of digestive organs: Secondary | ICD-10-CM

## 2016-08-27 DIAGNOSIS — Z7982 Long term (current) use of aspirin: Secondary | ICD-10-CM | POA: Diagnosis not present

## 2016-08-27 NOTE — Progress Notes (Signed)
Pt Has noted color changes in his nails.

## 2016-08-27 NOTE — Assessment & Plan Note (Signed)
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 On hydrea [phone] mg/day in last 2 weeks.   # Nail Pigmentation likely from Hydrea.   # I reviewed the data from 2 randomized clinical trials that showed slight benefit of Hydrea over anagrelide- in terms of improved efficacy/less toxicity [especially myelofibrosis].   # Elevated Blood pressure- Today 159/97. Patient is asymptomatic. States at home better controlled.  Recommend follow up with PCP.   # repeat labs in 2; 6; 12 weeks; follow up with MD in 12 weeks. If the platelets are still elevated recommend adding 1 mg of anagrelide [patient worried about side effects of Hydrea].

## 2016-08-27 NOTE — Progress Notes (Signed)
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  Patient noted to have pigmentation of his nail bed. Otherwise no source in the mouth or rashes. Patient denies any history of strokes or DVT or any other thromboembolic events. He denies any pain in his hands and feet.   He also denies any headaches or vision changes shortness of breath or swelling in the legs. He denies any weight loss night sweats or any fevers. His dose of Hydrea was increased approximately 2 weeks ago to 1500/day.   REVIEW OF SYSTEMS:  A complete 10 point review of system is done which is negative except mentioned above/history of present illness.   PAST MEDICAL HISTORY :  Past Medical History:  Diagnosis Date  . Essential thrombocytosis ([place])   . Helicobacter pylori ab+    resolved  . IDA (iron deficiency anemia)   . Neutropenia ([place])     PAST SURGICAL HISTORY :   Past Surgical History:  Procedure Laterality Date  . COLONOSCOPY  03/2013  . HIATAL HERNIA REPAIR    . UPPER GI ENDOSCOPY  11/2007    FAMILY HISTORY :   Family History  Problem Relation Age of Onset  . Kidney cancer Father 41    ? liver cancer  . Colon cancer Mother 86    SOCIAL HISTORY:   Social History  Substance Use Topics  . Smoking status: Never Smoker  . Smokeless tobacco: Never Used  . Alcohol use No    ALLERGIES:  has No Known Allergies.  MEDICATIONS:  Current Outpatient Prescriptions  Medication Sig Dispense Refill  . anagrelide (AGRYLIN) 0.5 MG capsule Take 5 capsules daily or as directed by physician 450 capsule 1  .  aspirin EC 81 MG tablet Take 1 tablet by mouth daily.    . fexofenadine (ALLEGRA) 180 MG tablet Take 1 tablet by mouth daily.    . hydrocortisone 2.5 % cream Apply 1 application topically 3 (three) times a week.  6  . hydroxyurea (HYDREA) 500 MG capsule Take 1 pill in AM; and 2 pills in [facility] take with food to minimize GI side effects. 90 capsule 3  . omeprazole (PRILOSEC) 40 MG capsule Take 40 mg by mouth daily.  3   No current facility-administered medications for this visit.     PHYSICAL EXAMINATION: ECOG PERFORMANCE STATUS: 0 - Asymptomatic  BP (!) 152/90   Pulse 66   Temp (!) 96.6 F (35.9 C) (Tympanic)   Resp 20   Ht 5\' 6"  (1.676 m)   Wt 158 lb 8 oz (71.9 kg)   BMI 25.58 kg/m   Filed Weights   08/27/16 1605  Weight: 158 lb 8 oz (71.9 kg)    GENERAL: Well-nourished well-developed; Alert, no distress and comfortable.   Alone. EYES: no pallor or icterus OROPHARYNX: no thrush or ulceration; good dentition  NECK: supple, no masses felt LYMPH:  no palpable lymphadenopathy in the cervical, axillary or inguinal regions LUNGS: clear to auscultation and  No wheeze or crackles HEART/CVS: regular rate & rhythm and no murmurs; No lower extremity edema ABDOMEN:abdomen soft, non-tender and normal bowel sounds Musculoskeletal:no  cyanosis of digits and no clubbing  PSYCH: alert & oriented x 3 with fluent speech NEURO: no focal motor/sensory deficits SKIN:  no rashes or significant lesions; except darkish pigmentation noted in the nail beds.   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 On hydrea [phone] mg/day in last 2 weeks.   # Nail Pigmentation likely from Hydrea.   # I reviewed the data from 2 randomized clinical trials that showed slight benefit of Hydrea over anagrelide- in terms of improved efficacy/less toxicity [especially myelofibrosis].   # Elevated Blood pressure- Today 159/97. Patient is asymptomatic. States at home better controlled.  Recommend follow up with PCP.   # repeat labs in 2; 6; 12 weeks; follow up with MD in 12 weeks. If the platelets are still elevated recommend adding 1 mg of anagrelide [patient worried about side effects of Hydrea].          Cammie Sickle, MD 08/27/2016 4:22 PM

## 2016-09-08 ENCOUNTER — Encounter: Payer: Self-pay | Admitting: Internal Medicine

## 2016-09-11 ENCOUNTER — Telehealth: Payer: Self-pay | Admitting: Internal Medicine

## 2016-09-11 NOTE — Telephone Encounter (Signed)
Please inform pt that his white count is slightly low [2.2/anc-0.8]hb- 12.5/platlets- 360; recommend cut down dose of hydrea to 500 BID; check counts as planned. Dr.B

## 2016-09-11 NOTE — Telephone Encounter (Signed)
Contacted patient. Spoke to patient regarding lab results.  Explained new hydrea dosing. Asked patient to dose reduce his hydrea to 500 mg twice daily.  Patient currently out of town in [place]. Reviewed handwashing precautions and neutropenic precautions to avoid infections. Patient states he already has a 'cold.'  Teach back process performed.

## 2016-10-09 ENCOUNTER — Encounter: Payer: Self-pay | Admitting: Internal Medicine

## 2016-11-03 ENCOUNTER — Telehealth: Payer: Self-pay | Admitting: *Deleted

## 2016-11-03 NOTE — Telephone Encounter (Signed)
Advised patient per Dr Rogue Bussing to use Saline nasal spray and that his [facility] form is ready to be picked up

## 2016-11-03 NOTE — Telephone Encounter (Signed)
Called to report that he is having bloody mucous from sinuses. Asking if it is from the [facility]. Also asking if he needs to have labs drawn before his appt 11/28/16. He will need a [facility] form.

## 2016-11-25 ENCOUNTER — Encounter: Payer: Self-pay | Admitting: *Deleted

## 2016-11-25 NOTE — Progress Notes (Signed)
[facility] Lab results from 12/21 reviewed by RN. Labs stable. Wbc (2.3) and ANC currently 0.9 unchanged since last lab draw on 11/9.  Reviewed chart. Pt has an apt with Dr. Janese Banks on 12/29 this week.  Pt current dose of hydrea is 500 mg twice daily.  Copy of labs given to Moishe Spice, RN for Dr. Janese Banks to review.

## 2016-11-28 ENCOUNTER — Inpatient Hospital Stay: Payer: 59

## 2016-11-28 ENCOUNTER — Inpatient Hospital Stay: Payer: 59 | Attending: Oncology | Admitting: Oncology

## 2016-11-28 VITALS — BP 144/94 | HR 93 | Temp 99.0°F | Resp 18 | Wt 161.4 lb

## 2016-11-28 DIAGNOSIS — Z800 Family history of malignant neoplasm of digestive organs: Secondary | ICD-10-CM | POA: Diagnosis not present

## 2016-11-28 DIAGNOSIS — Z7982 Long term (current) use of aspirin: Secondary | ICD-10-CM | POA: Diagnosis not present

## 2016-11-28 DIAGNOSIS — D509 Iron deficiency anemia, unspecified: Secondary | ICD-10-CM | POA: Diagnosis not present

## 2016-11-28 DIAGNOSIS — D473 Essential (hemorrhagic) thrombocythemia: Secondary | ICD-10-CM | POA: Diagnosis not present

## 2016-11-28 DIAGNOSIS — Z8051 Family history of malignant neoplasm of kidney: Secondary | ICD-10-CM | POA: Diagnosis not present

## 2016-11-28 DIAGNOSIS — Z79899 Other long term (current) drug therapy: Secondary | ICD-10-CM | POA: Diagnosis not present

## 2016-11-28 DIAGNOSIS — D709 Neutropenia, unspecified: Secondary | ICD-10-CM | POA: Insufficient documentation

## 2016-11-28 NOTE — Progress Notes (Signed)
Patient is here for follow up with his wife. He mentions that with the Hydrea he notes his nails are turning black/brown, he has nose bleeds and he feels like the other medication was better for him.

## 2016-11-28 NOTE — Progress Notes (Signed)
   Hematology/Oncology Consult note [facility]  [phone] [phone]  Patient Care Team: Marshall W Anderson, MD as PCP - General (Internal Medicine)   Name of the patient: Jordan Jefferson  [id number]  07/26/1966   Date of visit: 11/28/16  Diagnosis- essential thrombocytosis  Chief complaint/ Reason for visit- routine follow-up of essential thrombocytosis  Heme/Onc history:  Patient is a 50-year-old African-American male with a history of essential thrombocytosis which was diagnosed in 2000. He is not sure if he has ever had a bone marrow biopsy done. He does report that at some point in the past 6 platelet count rose to 1 million which was the reason he was started on treatment. Initially he was started on anagrelide but was later switched to Hydrea. He is currently on hydroxyurea 500 mg twice a day. In October 2017 his dose was reduced due to leukopenia  Interval history- currently reports no fevers but has widely cold.    Review of systems- Review of Systems  Constitutional: Negative for chills, fever, malaise/fatigue and weight loss.  HENT: Negative for congestion, ear discharge and nosebleeds.   Eyes: Negative for blurred vision.  Respiratory: Negative for cough, hemoptysis, sputum production, shortness of breath and wheezing.   Cardiovascular: Negative for chest pain, palpitations, orthopnea and claudication.  Gastrointestinal: Negative for abdominal pain, blood in stool, constipation, diarrhea, heartburn, melena, nausea and vomiting.  Genitourinary: Negative for dysuria, flank pain, frequency, hematuria and urgency.  Musculoskeletal: Negative for back pain, joint pain and myalgias.  Skin: Negative for rash.  Neurological: Negative for dizziness, tingling, focal weakness, seizures, weakness and headaches.  Endo/Heme/Allergies: Does not bruise/bleed easily.  Psychiatric/Behavioral: Negative for depression and suicidal ideas. The  patient does not have insomnia.      Current treatment-  Hydrea   No Known Allergies   Past Medical History:  Diagnosis Date  . Essential thrombocytosis (HCC)   . Helicobacter pylori ab+    resolved  . IDA (iron deficiency anemia)   . Neutropenia (HCC)      Past Surgical History:  Procedure Laterality Date  . COLONOSCOPY  03/2013  . HIATAL HERNIA REPAIR    . UPPER GI ENDOSCOPY  11/2007    Social History   Social History  . Marital status: Married    Spouse name: N/A  . Number of children: N/A  . Years of education: N/A   Occupational History  . Not on file.   Social History Main Topics  . Smoking status: Never Smoker  . Smokeless tobacco: Never Used  . Alcohol use No  . Drug use: No  . Sexual activity: Yes    Partners: Female   Other Topics Concern  . Not on file   Social History Narrative  . No narrative on file    Family History  Problem Relation Age of Onset  . Kidney cancer Father 60    ? liver cancer  . Colon cancer Mother 58     Current Outpatient Prescriptions:  .  anagrelide (AGRYLIN) 0.5 MG capsule, Take 5 capsules daily or as directed by physician, Disp: 450 capsule, Rfl: 1 .  aspirin EC 81 MG tablet, Take 1 tablet by mouth daily., Disp: , Rfl:  .  fexofenadine (ALLEGRA) 180 MG tablet, Take 1 tablet by mouth daily., Disp: , Rfl:  .  hydrocortisone 2.5 % cream, Apply 1 application topically 3 (three) times a week., Disp: , Rfl: 6 .  hydroxyurea (HYDREA) 500 MG capsule, Take 1 pill   in AM; and 2 pills in PNMay take with food to minimize GI side effects., Disp: 90 capsule, Rfl: 3 .  omeprazole (PRILOSEC) 40 MG capsule, Take 40 mg by mouth daily., Disp: , Rfl: 3  Physical exam:  Vitals:   11/28/16 1538  BP: (!) 144/94  Pulse: 93  Resp: 18  Temp: 99 F (37.2 C)  TempSrc: Tympanic  Weight: 161 lb 6 oz (73.2 kg)   Physical Exam  Constitutional: He is oriented to person, place, and time and well-developed, well-nourished, and in no  distress.  HENT:  Head: Normocephalic and atraumatic.  Eyes: EOM are normal. Pupils are equal, round, and reactive to light.  Neck: Normal range of motion.  Cardiovascular: Normal rate, regular rhythm and normal heart sounds.   Pulmonary/Chest: Effort normal and breath sounds normal.  Abdominal: Soft. Bowel sounds are normal.  Neurological: He is alert and oriented to person, place, and time.  Skin: Skin is warm and dry.     CMP Latest Ref Rng & Units 11/01/2014  Creatinine 0.60 - 1.30 mg/dL 0.93  Some recent data might be hidden   CBC Latest Ref Rng & Units 07/18/2015  WBC 3.8 - 10.6 K/uL 4.6  Hemoglobin 13.0 - 18.0 g/dL 13.6  Hematocrit 40.0 - 52.0 % 41.7  Platelets 150 - 440 K/uL 351  Some recent data might be hidden    The patient did have a recent CBC on 11/20/2016. WBC count was 2.3 with an ANC of 0.9. H&H was 12/35.2 and platelet count was 451. CMP was within normal limits.  Assessment and plan- Patient is a 50 y.o. male With a history of essential thrombocytosis currently on Hydrea   1. Essential thrombocytosis - patient continues to have leukopenia/ neutropenia despite reducing the dose of Hydrea. Patient is 50 and his prior records indicate that he his jak 2 negative. I am unable to verify the his bone marrow biopsy report and jak 2 mutation testing. He has never had any episodes of arterial or venous thrombosis. He also does not have any other Known high risk cardiovascular risk factors such as hypertension or diabetes. In my opinion he does not need cytoreductive therapy at this time as he falls into the category of low/ very low risk ET. I will continue 81 mg of aspirin but I will hold off on his Hydrea given his ongoing leukopenia/neutropenia. We will recheck his CBC in 2 weeks time. He will follow-up with Dr. Brahmanday in 1 month's time with a repeat CBC and he will have further discussions about whether cytoreductive therapy needs to be restarted at that time. I did inform  the patient that he has moderate neutropenia which should hopefully get better after holding his Hydrea. However if he develops any fevers or infectious symptoms he should inform us sooner. I will also attempt to retrieve this paper charts from prior to see what workup was done for his essential thrombocytosis   Visit Diagnosis 1. Essential thrombocythemia (HCC)      Dr.  , MD, MPH [facility] Pager- [phone] 11/28/2016 12:27 PM                

## 2016-12-08 ENCOUNTER — Telehealth: Payer: Self-pay | Admitting: *Deleted

## 2016-12-16 ENCOUNTER — Encounter: Payer: Self-pay | Admitting: Oncology

## 2016-12-22 ENCOUNTER — Encounter: Payer: Self-pay | Admitting: Oncology

## 2016-12-24 ENCOUNTER — Inpatient Hospital Stay: Payer: 59 | Attending: Internal Medicine | Admitting: Internal Medicine

## 2016-12-24 VITALS — BP 144/82 | HR 61 | Temp 97.8°F | Resp 20 | Ht 66.0 in | Wt 157.8 lb

## 2016-12-24 DIAGNOSIS — Z8051 Family history of malignant neoplasm of kidney: Secondary | ICD-10-CM

## 2016-12-24 DIAGNOSIS — D709 Neutropenia, unspecified: Secondary | ICD-10-CM

## 2016-12-24 DIAGNOSIS — Z79899 Other long term (current) drug therapy: Secondary | ICD-10-CM | POA: Diagnosis not present

## 2016-12-24 DIAGNOSIS — D509 Iron deficiency anemia, unspecified: Secondary | ICD-10-CM

## 2016-12-24 DIAGNOSIS — Z8619 Personal history of other infectious and parasitic diseases: Secondary | ICD-10-CM | POA: Diagnosis not present

## 2016-12-24 DIAGNOSIS — D473 Essential (hemorrhagic) thrombocythemia: Secondary | ICD-10-CM | POA: Diagnosis present

## 2016-12-24 DIAGNOSIS — R030 Elevated blood-pressure reading, without diagnosis of hypertension: Secondary | ICD-10-CM

## 2016-12-24 DIAGNOSIS — Z7982 Long term (current) use of aspirin: Secondary | ICD-10-CM

## 2016-12-24 DIAGNOSIS — Z800 Family history of malignant neoplasm of digestive organs: Secondary | ICD-10-CM

## 2016-12-24 NOTE — Assessment & Plan Note (Addendum)
#  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 LOW RISK .  On hydrea [phone] mg/day; but held for 4 weeks because of neutropenia. Recent labs- show- N- wbc; Hb; platelets- 641. I reviewed the labs with the patient.  # Had a long discussion with the patient regarding the overall low risk of thrombotic complications approximately 1% per year from his ET. Also discussed the other potential causes of elevated platelets include secondary versus primary causes like myelofibrosis; CML [very unlikely given the lack of any potential complications without treatment]. He is interested in a bone marrow biopsy. I think it's reasonable. Will set up for bone marrow. Discussed the potential complications and the procedure details. We have been through IR.  # Elevated Blood pressure- Today 144/82. Patient is asymptomatic. States at home better controlled.     # have labs 2 days [[facility]] prior to Bone marrow Bx/ follow-up with me 1 week after the bone marrow biopsy.

## 2016-12-24 NOTE — Progress Notes (Signed)
[place] OFFICE PROGRESS NOTE  Patient Care Team: Kirk Ruths, MD as PCP - General (Internal Medicine)   SUMMARY OF ONCOLOGIC HISTORY:  # 2000- ESSENTIAL THROMBOCYTOSIS [incidental]- Jak 2 Caren Hazy ] NEG on Anagrelide 4 mg/d & asprin 24m/d; June 2017- STart hydrea; DEC 2017- STOPPED sec to neutropenia  INTERVAL HISTORY:  A very pleasant 51year old African-American male patient with a history of essential thrombocytosis currently on Hydrea is here for follow-up.  Patient Hydrea has been on hold for approximately 4 weeks because of neutropenia. Patient denies any history of strokes or DVT or any other thromboembolic events. He denies any pain in his hands and feet.   He also denies any headaches or vision changes shortness of breath or swelling in the legs. He denies any weight loss night sweats or any fevers.   REVIEW OF SYSTEMS:  A complete 10 point review of system is done which is negative except mentioned above/history of present illness.   PAST MEDICAL HISTORY :  Past Medical History:  Diagnosis Date  . Essential thrombocytosis ([place]   . Helicobacter pylori ab+    resolved  . IDA (iron deficiency anemia)   . Neutropenia ([place]     PAST SURGICAL HISTORY :   Past Surgical History:  Procedure Laterality Date  . COLONOSCOPY  03/2013  . HIATAL HERNIA REPAIR    . UPPER GI ENDOSCOPY  11/2007    FAMILY HISTORY :   Family History  Problem Relation Age of Onset  . Kidney cancer Father 627   ? liver cancer  . Colon cancer Mother 525   SOCIAL HISTORY:   Social History  Substance Use Topics  . Smoking status: Never Smoker  . Smokeless tobacco: Never Used  . Alcohol use No    ALLERGIES:  has No Known Allergies.  MEDICATIONS:  Current Outpatient Prescriptions  Medication Sig Dispense Refill  . aspirin EC 81 MG tablet Take 1 tablet by mouth daily.    . hydrocortisone 2.5 % cream Apply 1 application topically 3 (three) times a week.  6  .  omeprazole (PRILOSEC) 40 MG capsule Take 40 mg by mouth daily.  3  . hydroxyurea (HYDREA) 500 MG capsule Take 1 pill in AM; and [facility] to [facility] GI side effects. (Patient not taking: Reported on 12/24/2016) 90 capsule 3   No current facility-administered medications for this visit.     PHYSICAL EXAMINATION: ECOG PERFORMANCE STATUS: 0 - Asymptomatic  BP (!) 144/82 (BP Location: Left Arm, Patient Position: Sitting)   Pulse 61   Temp 97.8 F (36.6 C) (Tympanic)   Resp 20   Ht 5' 6"  (1.676 m)   Wt 157 lb 12.8 oz (71.6 kg)   BMI 25.47 kg/m   Filed Weights   12/24/16 1117  Weight: 157 lb 12.8 oz (71.6 kg)    GENERAL: Well-nourished well-developed; Alert, no distress and comfortable.   Alone. EYES: no pallor or icterus OROPHARYNX: no thrush or ulceration; good dentition  NECK: supple, no masses felt LYMPH:  no palpable lymphadenopathy in the cervical, axillary or inguinal regions LUNGS: clear to auscultation and  No wheeze or crackles HEART/CVS: regular rate & rhythm and no murmurs; No lower extremity edema ABDOMEN:abdomen soft, non-tender and normal bowel sounds Musculoskeletal:no cyanosis of digits and no clubbing  PSYCH: alert & oriented x 3 with fluent speech NEURO: no focal motor/sensory deficits SKIN:  no rashes or significant lesions; except darkish pigmentation noted in  the nail beds.   LABORATORY DATA:  I have reviewed the data as listed    Component Value Date/Time   CREATININE 0.93 11/01/2014 1538   GFRNONAA >60 11/01/2014 1538   GFRAA >60 11/01/2014 1538    No results found for: SPEP, UPEP  Lab Results  Component Value Date   WBC 4.6 07/18/2015   NEUTROABS 2.3 07/18/2015   HGB 13.6 07/18/2015   HCT 41.7 07/18/2015   MCV 82.3 07/18/2015   PLT 351 07/18/2015      Chemistry      Component Value Date/Time   CREATININE 0.93 11/01/2014 1538   No results found for: CALCIUM, ALKPHOS, AST, ALT, BILITOT      ASSESSMENT & PLAN:    Essential thrombocythemia ([place]) # 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 LOW RISK .  On hydrea [phone] mg/day; but held for 4 weeks because of neutropenia. Recent labs- show- N- wbc; Hb; platelets- 641. I reviewed the labs with the patient.  # Had a long discussion with the patient regarding the overall low risk of thrombotic complications approximately 1% per year from his ET. Also discussed the other potential causes of elevated platelets include secondary versus primary causes like myelofibrosis; CML [very unlikely given the lack of any potential complications without treatment]. He is interested in a bone marrow biopsy. I think it's reasonable. Will set up for bone marrow. Discussed the potential complications and the procedure details. We have been through IR.  # Elevated Blood pressure- Today 144/82. Patient is asymptomatic. States at home better controlled.     # have labs 2 days [[facility]] prior to Bone marrow Bx/ follow-up with me 1 week after the bone marrow biopsy.     Cammie Sickle, MD 12/24/2016 2:14 PM

## 2016-12-24 NOTE — Progress Notes (Signed)
Jordan Jefferson currently on hold. Patient would like to discuss his treatment plan with Dr. Rogue Bussing. He states that Dr. Hedwig Morton d/c his Jordan Jefferson at last office visit. He feels much better being off the Jordan Jefferson due to fatigue and abdominal discomfort. He prefers not to start back on Jordan Jefferson. "i tolerated the anagrelide much better before starting the Jordan Jefferson." pt reports intermittent numbness and tingling in his fingertips.

## 2016-12-26 ENCOUNTER — Telehealth: Payer: Self-pay | Admitting: *Deleted

## 2016-12-26 ENCOUNTER — Ambulatory Visit: Payer: 59 | Admitting: Internal Medicine

## 2016-12-26 NOTE — Telephone Encounter (Signed)
Asking about charges and codes for his upcoming BMB. I transferred him to [facility] phone and asked that he leave her a message

## 2016-12-29 ENCOUNTER — Ambulatory Visit: Payer: 59 | Admitting: Internal Medicine

## 2016-12-30 ENCOUNTER — Encounter: Payer: Self-pay | Admitting: Internal Medicine

## 2016-12-30 ENCOUNTER — Other Ambulatory Visit: Payer: Self-pay | Admitting: General Surgery

## 2016-12-31 ENCOUNTER — Telehealth: Payer: Self-pay | Admitting: *Deleted

## 2016-12-31 NOTE — Telephone Encounter (Signed)
Patient informed of labs values. Explained that no new recommendations on lab mgmt at this time. plts are 707. Will wait on bone marrow biopsy results. Pt states that he just called and moved his md apt to next Friday for results. He asked me if this was ok to do so. I told him that the apt date change was fine with Dr. Rogue Bussing.Marland Kitchen He thanked me for calling him.

## 2016-12-31 NOTE — Telephone Encounter (Signed)
-----   Message from Cammie Sickle, MD sent at 12/31/2016  8:54 AM EST ----- Please inform pt that wbc- okay; platelets- up as expected as not on treatment; await BMBx- and follow up as planned. No new recommendations. Continue asprin- Dr.B

## 2017-01-01 ENCOUNTER — Ambulatory Visit
Admission: RE | Admit: 2017-01-01 | Discharge: 2017-01-01 | Disposition: A | Discharge status: Home / Self Care | Payer: 59 | Source: Ambulatory Visit | Attending: Internal Medicine | Admitting: Internal Medicine

## 2017-01-01 ENCOUNTER — Encounter: Payer: Self-pay | Admitting: Internal Medicine

## 2017-01-01 DIAGNOSIS — D473 Essential (hemorrhagic) thrombocythemia: Secondary | ICD-10-CM | POA: Insufficient documentation

## 2017-01-01 DIAGNOSIS — D709 Neutropenia, unspecified: Secondary | ICD-10-CM | POA: Diagnosis not present

## 2017-01-01 DIAGNOSIS — D7589 Other specified diseases of blood and blood-forming organs: Secondary | ICD-10-CM | POA: Insufficient documentation

## 2017-01-01 HISTORY — DX: Gastro-esophageal reflux disease without esophagitis: K21.9

## 2017-01-01 LAB — CBC
HCT: 39.1 % — ABNORMAL LOW (ref 40.0–52.0)
HEMOGLOBIN: 13 g/dL (ref 13.0–18.0)
MCH: 34.5 pg — AB (ref 26.0–34.0)
MCHC: 33.4 g/dL (ref 32.0–36.0)
MCV: 103.3 fL — ABNORMAL HIGH (ref 80.0–100.0)
PLATELETS: 666 10*3/uL — AB (ref 150–440)
RBC: 3.79 MIL/uL — ABNORMAL LOW (ref 4.40–5.90)
RDW: 14.3 % (ref 11.5–14.5)
WBC: 3 10*3/uL — ABNORMAL LOW (ref 3.8–10.6)

## 2017-01-01 LAB — DIFFERENTIAL
BASOS PCT: 3 %
Basophils Absolute: 0.1 10*3/uL (ref 0–0.1)
EOS ABS: 0.2 10*3/uL (ref 0–0.7)
EOS PCT: 6 %
Lymphocytes Relative: 29 %
Lymphs Abs: 0.9 10*3/uL — ABNORMAL LOW (ref 1.0–3.6)
MONO ABS: 0.6 10*3/uL (ref 0.2–1.0)
MONOS PCT: 20 %
Neutro Abs: 1.3 10*3/uL — ABNORMAL LOW (ref 1.4–6.5)
Neutrophils Relative %: 42 %

## 2017-01-01 LAB — PROTIME-INR
INR: 0.97
PROTHROMBIN TIME: 12.9 s (ref 11.4–15.2)

## 2017-01-01 LAB — APTT: APTT: 30 s (ref 24–36)

## 2017-01-01 MED ORDER — SODIUM CHLORIDE 0.9 % IV SOLN
INTRAVENOUS | Status: DC
  Administered 2017-01-01: 08:00:00 via INTRAVENOUS

## 2017-01-01 MED ORDER — BUPIVACAINE HCL (PF) 0.25 % IJ SOLN
INTRAMUSCULAR | Status: AC
  Filled 2017-01-01: qty 30

## 2017-01-01 MED ORDER — HYDROCODONE-ACETAMINOPHEN 5-325 MG PO TABS
1.0000 | ORAL_TABLET | ORAL | Status: DC | PRN
  Filled 2017-01-01: qty 2

## 2017-01-01 MED ORDER — HYDROCODONE-ACETAMINOPHEN 5-325 MG PO TABS
ORAL_TABLET | ORAL | Status: AC
  Filled 2017-01-01: qty 1

## 2017-01-01 MED ORDER — FENTANYL CITRATE (PF) 100 MCG/2ML IJ SOLN
INTRAMUSCULAR | Status: AC | PRN
  Administered 2017-01-01: 25 ug via INTRAVENOUS

## 2017-01-01 NOTE — Procedures (Signed)
CT Bone marrow bx without difficulty  Complications:  None  Blood Loss: none  See dictation in canopy pacs  

## 2017-01-08 ENCOUNTER — Ambulatory Visit: Payer: Self-pay | Admitting: Internal Medicine

## 2017-01-09 ENCOUNTER — Inpatient Hospital Stay: Payer: 59 | Attending: Internal Medicine | Admitting: Internal Medicine

## 2017-01-09 VITALS — BP 123/77 | HR 73 | Temp 97.3°F | Wt 161.0 lb

## 2017-01-09 DIAGNOSIS — Z8619 Personal history of other infectious and parasitic diseases: Secondary | ICD-10-CM

## 2017-01-09 DIAGNOSIS — D509 Iron deficiency anemia, unspecified: Secondary | ICD-10-CM | POA: Diagnosis not present

## 2017-01-09 DIAGNOSIS — Z800 Family history of malignant neoplasm of digestive organs: Secondary | ICD-10-CM | POA: Diagnosis not present

## 2017-01-09 DIAGNOSIS — Z79899 Other long term (current) drug therapy: Secondary | ICD-10-CM | POA: Diagnosis not present

## 2017-01-09 DIAGNOSIS — D473 Essential (hemorrhagic) thrombocythemia: Secondary | ICD-10-CM | POA: Diagnosis not present

## 2017-01-09 DIAGNOSIS — Z7982 Long term (current) use of aspirin: Secondary | ICD-10-CM

## 2017-01-09 DIAGNOSIS — D709 Neutropenia, unspecified: Secondary | ICD-10-CM

## 2017-01-09 DIAGNOSIS — K219 Gastro-esophageal reflux disease without esophagitis: Secondary | ICD-10-CM | POA: Diagnosis not present

## 2017-01-09 MED ORDER — ANAGRELIDE HCL 1 MG PO CAPS: 1.0000 mg | ORAL_CAPSULE | Freq: Two times a day (BID) | ORAL | 2 refills | Status: DC

## 2017-01-09 NOTE — Progress Notes (Signed)
[place] OFFICE PROGRESS NOTE  Patient Care Team: Kirk Ruths, MD as PCP - General (Internal Medicine)   SUMMARY OF ONCOLOGIC HISTORY:  Oncology History   # 2000- ESSENTIAL THROMBOCYTOSIS [incidental]- Jak 2 Caren Hazy ] NEG on Anagrelide 4 mg/d & asprin 1m/d; June 2017- STart hydrea; [facility] to [facility]; FEB 2018- BMBx- megakaryocytes; MPN; awaiting on MPL/Jak/CALR.      Essential thrombocythemia ([place]     INTERVAL HISTORY:  A very pleasant 51year old African-American male patient with a history of essential thrombocytosis currently on Hydrea is here for follow-up/ To review the results of his bone marrow biopsy.  Patient has been off Hydrea for the last 2 months or so because of neutropenia  Patient denies any history of strokes or DVT or any other thromboembolic events. He denies any pain in his hands and feet.   He also denies any headaches or vision changes shortness of breath or swelling in the legs. He denies any weight loss night sweats or any fevers.   REVIEW OF SYSTEMS:  A complete 10 point review of system is done which is negative except mentioned above/history of present illness.   PAST MEDICAL HISTORY :  Past Medical History:  Diagnosis Date  . Essential thrombocytosis ([place]   . GERD (gastroesophageal reflux disease)   . Helicobacter pylori ab+    resolved  . IDA (iron deficiency anemia)   . Neutropenia ([place]     PAST SURGICAL HISTORY :   Past Surgical History:  Procedure Laterality Date  . COLONOSCOPY  03/2013  . HIATAL HERNIA REPAIR    . UPPER GI ENDOSCOPY  11/2007    FAMILY HISTORY :   Family History  Problem Relation Age of Onset  . Kidney cancer Father 611   ? liver cancer  . Colon cancer Mother 537   SOCIAL HISTORY:   Social History  Substance Use Topics  . Smoking status: Never Smoker  . Smokeless tobacco: Never Used  . Alcohol use No     Comment: rarely    ALLERGIES:  has No Known  Allergies.  MEDICATIONS:  Current Outpatient Prescriptions  Medication Sig Dispense Refill  . aspirin EC 81 MG tablet Take 1 tablet by mouth daily.    . hydrocortisone 2.5 % cream Apply 1 application topically 3 (three) times a week.  6  . omeprazole (PRILOSEC) 40 MG capsule Take 40 mg by mouth daily.  3  . anagrelide (AGRYLIN) 1 MG capsule Take 1 capsule (1 mg total) by mouth 2 (two) times daily. 180 capsule 2  . fexofenadine (ALLEGRA) 180 MG tablet     . Fluocinolone Acetonide Body 0.01 % OIL      No current facility-administered medications for this visit.     PHYSICAL EXAMINATION: ECOG PERFORMANCE STATUS: 0 - Asymptomatic  BP 123/77 (BP Location: Left Arm, Patient Position: Sitting)   Pulse 73   Temp 97.3 F (36.3 C) (Tympanic)   Wt 161 lb (73 kg)   BMI 25.99 kg/m   Filed Weights   01/09/17 1408  Weight: 161 lb (73 kg)    GENERAL: Well-nourished well-developed; Alert, no distress and comfortable. He is accompanied by his wife EYES: no pallor or icterus OROPHARYNX: no thrush or ulceration; good dentition  NECK: supple, no masses felt LYMPH:  no palpable lymphadenopathy in the cervical, axillary or inguinal regions LUNGS: clear to auscultation and  No wheeze or crackles HEART/CVS: regular rate & rhythm and no murmurs; No  lower extremity edema ABDOMEN:abdomen soft, non-tender and normal bowel sounds Musculoskeletal:no cyanosis of digits and no clubbing  PSYCH: alert & oriented x 3 with fluent speech NEURO: no focal motor/sensory deficits SKIN:  no rashes or significant lesions; except darkish pigmentation noted in the nail beds.   LABORATORY DATA:  I have reviewed the data as listed    Component Value Date/Time   CREATININE 0.93 11/01/2014 1538   GFRNONAA >60 11/01/2014 1538   GFRAA >60 11/01/2014 1538    No results found for: SPEP, UPEP  Lab Results  Component Value Date   WBC 3.0 (L) 01/01/2017   NEUTROABS 1.3 (L) 01/01/2017   HGB 13.0 01/01/2017   HCT  39.1 (L) 01/01/2017   MCV 103.3 (H) 01/01/2017   PLT 666 (H) 01/01/2017      Chemistry      Component Value Date/Time   CREATININE 0.93 11/01/2014 1538   No results found for: CALCIUM, ALKPHOS, AST, ALT, BILITOT      ASSESSMENT & PLAN:   Essential thrombocythemia ([place]) # 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 LOW RISK . Recent labs- show- wbc-3.0; Hb; platelets- 666.BMBx- s/o MPN. I spoke to hemato-pathologist re: BMBx.   # Discussed the microvascular complications and also the macrovascular complications of essential thrombocytosis. I also discussed the rationale for using aspirin is to treat the microvascular complications. Patient will continue aspirin. Also discussed the use of agents-Hydrea and anagrelide for cytoreduction; however he is at low risk for any thrombotic phenomena. Patient prefers to be on cytoreductive therapy. We'll restart the patient on anagrelide 1 mg twice a day. New script given.   # Blood pressure better controlled.   # Follow-up in approximately 3 months; labs CBC monthly basis.  # 25 minutes face-to-face with the patient discussing the above plan of care; more than 50% of time spent on prognosis/ natural history; counseling and coordination.     Cammie Sickle, MD 01/09/2017 4:50 PM

## 2017-01-09 NOTE — Progress Notes (Signed)
Patient here today for follow up.  Patient states no new concerns today  

## 2017-01-09 NOTE — Assessment & Plan Note (Addendum)
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 LOW RISK . Recent labs- show- wbc-3.0; Hb; platelets- 666.BMBx- s/o MPN. I spoke to hemato-pathologist re: BMBx.   # Discussed the microvascular complications and also the macrovascular complications of essential thrombocytosis. I also discussed the rationale for using aspirin is to treat the microvascular complications. Patient will continue aspirin. Also discussed the use of agents-Hydrea and anagrelide for cytoreduction; however he is at low risk for any thrombotic phenomena. Patient prefers to be on cytoreductive therapy. We'll restart the patient on anagrelide 1 mg twice a day. New script given.   # Blood pressure better controlled.   # Follow-up in approximately 3 months; labs CBC monthly basis.  # 25 minutes face-to-face with the patient discussing the above plan of care; more than 50% of time spent on prognosis/ natural history; counseling and coordination.

## 2017-02-04 ENCOUNTER — Encounter: Payer: Self-pay | Admitting: Internal Medicine

## 2017-03-11 ENCOUNTER — Encounter: Payer: Self-pay | Admitting: Internal Medicine

## 2017-04-10 ENCOUNTER — Encounter: Payer: Self-pay | Admitting: Internal Medicine

## 2017-04-10 ENCOUNTER — Inpatient Hospital Stay (HOSPITAL_BASED_OUTPATIENT_CLINIC_OR_DEPARTMENT_OTHER): Payer: 59 | Admitting: Internal Medicine

## 2017-04-10 DIAGNOSIS — Z122 Encounter for screening for malignant neoplasm of respiratory organs: Secondary | ICD-10-CM | POA: Diagnosis not present

## 2017-04-10 NOTE — Progress Notes (Signed)
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      Essential thrombocythemia ([place])     INTERVAL HISTORY:  Pt late for appt/ rescheduled. NOT SEEN in clinic.    REVIEW OF SYSTEMS:  A complete 10 point review of system is done which is negative except mentioned above/history of present illness.   PAST MEDICAL HISTORY :  Past Medical History:  Diagnosis Date  . Essential thrombocytosis ([place])   . GERD (gastroesophageal reflux disease)   . Helicobacter pylori ab+    resolved  . IDA (iron deficiency anemia)   . Neutropenia ([place])     PAST SURGICAL HISTORY :   Past Surgical History:  Procedure Laterality Date  . COLONOSCOPY  03/2013  . HIATAL HERNIA REPAIR    . UPPER GI ENDOSCOPY  11/2007    FAMILY HISTORY :   Family History  Problem Relation Age of Onset  . Kidney cancer Father 9       ? liver cancer  . Colon cancer Mother 9    SOCIAL HISTORY:   Social History  Substance Use Topics  . Smoking status: Never Smoker  . Smokeless tobacco: Never Used  . Alcohol use No     Comment: rarely    ALLERGIES:  has No Known Allergies.  MEDICATIONS:  Current Outpatient Prescriptions  Medication Sig Dispense Refill  . anagrelide (AGRYLIN) 1 MG capsule Take 1 capsule (1 mg total) by mouth 2 (two) times daily. 180 capsule 2  . aspirin EC 81 MG tablet Take 1 tablet by mouth daily.    . fexofenadine (ALLEGRA) 180 MG tablet     . Fluocinolone Acetonide Body 0.01 % OIL     . hydrocortisone 2.5 % cream Apply 1 application topically 3 (three) times a week.  6  . omeprazole (PRILOSEC)  40 MG capsule Take 40 mg by mouth daily.  3   No current facility-administered medications for this visit.     PHYSICAL EXAMINATION: ECOG PERFORMANCE STATUS: 0 - Asymptomatic  There were no vitals taken for this visit.  There were no vitals filed for this visit.  GENERAL: Well-nourished well-developed; Alert, no distress and comfortable. He is accompanied by his wife EYES: no pallor or icterus OROPHARYNX: no thrush or ulceration; good dentition  NECK: supple, no masses felt LYMPH:  no palpable lymphadenopathy in the cervical, axillary or inguinal regions LUNGS: clear to auscultation and  No wheeze or crackles HEART/CVS: regular rate & rhythm and no murmurs; No lower extremity edema ABDOMEN:abdomen soft, non-tender and normal bowel sounds Musculoskeletal:no cyanosis of digits and no clubbing  PSYCH: alert & oriented x 3 with fluent speech NEURO: no focal motor/sensory deficits SKIN:  no rashes or significant lesions; except darkish pigmentation noted in the nail beds.   LABORATORY DATA:  I have reviewed the data as listed    Component Value Date/Time   CREATININE 0.93 11/01/2014 1538   GFRNONAA >60 11/01/2014 1538   GFRAA >60 11/01/2014 1538    No results found for: SPEP, UPEP  Lab Results  Component Value Date   WBC 3.0 (L) 01/01/2017   NEUTROABS 1.3 (  L) 01/01/2017   HGB 13.0 01/01/2017   HCT 39.1 (L) 01/01/2017   MCV 103.3 (H) 01/01/2017   PLT 666 (H) 01/01/2017      Chemistry      Component Value Date/Time   CREATININE 0.93 11/01/2014 1538   No results found for: CALCIUM, ALKPHOS, AST, ALT, BILITOT      ASSESSMENT & PLAN:   Essential thrombocythemia ([place]) # LOW RISK Essential thrombocytosis-CALR MUTATION POSITIVE- on anagrelide milligrams twice a day. 03/10/2017- CBC shows platelet count- 543; normal hemoglobin white count.   # Follow-up labs in 2 months follow-up in 4 months.     Cammie Sickle, MD 04/10/2017 4:09 PM

## 2017-04-10 NOTE — Assessment & Plan Note (Addendum)
#   LOW RISK Essential thrombocytosis-CALR MUTATION POSITIVE- on anagrelide milligrams twice a day. 03/10/2017- CBC shows platelet count- 543; normal hemoglobin white count.   # Follow-up labs in 2 months follow-up in 4 months.

## 2017-04-13 ENCOUNTER — Inpatient Hospital Stay: Payer: 59 | Attending: Internal Medicine | Admitting: Internal Medicine

## 2017-04-13 VITALS — BP 128/80 | HR 87 | Temp 96.7°F | Resp 156 | Wt 159.4 lb

## 2017-04-13 DIAGNOSIS — Z8051 Family history of malignant neoplasm of kidney: Secondary | ICD-10-CM | POA: Diagnosis not present

## 2017-04-13 DIAGNOSIS — Z800 Family history of malignant neoplasm of digestive organs: Secondary | ICD-10-CM | POA: Insufficient documentation

## 2017-04-13 DIAGNOSIS — D473 Essential (hemorrhagic) thrombocythemia: Secondary | ICD-10-CM | POA: Insufficient documentation

## 2017-04-13 DIAGNOSIS — Z7982 Long term (current) use of aspirin: Secondary | ICD-10-CM | POA: Diagnosis not present

## 2017-04-13 DIAGNOSIS — D709 Neutropenia, unspecified: Secondary | ICD-10-CM | POA: Insufficient documentation

## 2017-04-13 DIAGNOSIS — Z79899 Other long term (current) drug therapy: Secondary | ICD-10-CM | POA: Diagnosis not present

## 2017-04-13 DIAGNOSIS — D509 Iron deficiency anemia, unspecified: Secondary | ICD-10-CM | POA: Diagnosis not present

## 2017-04-13 DIAGNOSIS — Z8619 Personal history of other infectious and parasitic diseases: Secondary | ICD-10-CM | POA: Insufficient documentation

## 2017-04-13 DIAGNOSIS — K219 Gastro-esophageal reflux disease without esophagitis: Secondary | ICD-10-CM | POA: Diagnosis not present

## 2017-04-13 NOTE — Progress Notes (Signed)
Patient here today for follow up.  Patient states no new concerns today  

## 2017-04-13 NOTE — Assessment & Plan Note (Addendum)
#   LOW RISK Essential thrombocytosis-CALR MUTATION POSITIVE- on anagrelide milligrams twice a day. 03/10/2017- CBC shows platelet count- 543; normal hemoglobin white count.   # Follow-up labs in 2 months follow-up in 4 months.

## 2017-04-13 NOTE — Progress Notes (Signed)
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      Essential thrombocythemia [facility])     INTERVAL HISTORY:  51 year old male patient with above history of essential thrombocytosis currently on anagrelide is here for follow-up.    Patient denies any blood lots. Denies any tingling or numbness of burning pain in his hand and feet. Tolerating Hydrea fairly well without any major side effects.   REVIEW OF SYSTEMS:  A complete 10 point review of system is done which is negative except mentioned above/history of present illness.   PAST MEDICAL HISTORY :  Past Medical History:  Diagnosis Date  . Essential thrombocytosis ([place])   . GERD (gastroesophageal reflux disease)   . Helicobacter pylori ab+    resolved  . IDA (iron deficiency anemia)   . Neutropenia ([place])     PAST SURGICAL HISTORY :   Past Surgical History:  Procedure Laterality Date  . COLONOSCOPY  03/2013  . HIATAL HERNIA REPAIR    . UPPER GI ENDOSCOPY  11/2007    FAMILY HISTORY :   Family History  Problem Relation Age of Onset  . Kidney cancer Father 42       ? liver cancer  . Colon cancer Mother 51    SOCIAL HISTORY:   Social History  Substance Use Topics  . Smoking status: Never Smoker  . Smokeless tobacco: Never Used  . Alcohol use No     Comment: rarely    ALLERGIES:  has No Known Allergies.  MEDICATIONS:  Current Outpatient Prescriptions  Medication Sig Dispense Refill  . anagrelide (AGRYLIN) 1 MG capsule Take 1 capsule (1 mg total) by mouth 2 (two) times daily. 180 capsule 2  . aspirin EC 81 MG tablet  Take 1 tablet by mouth daily.    . fexofenadine (ALLEGRA) 180 MG tablet     . Fluocinolone Acetonide Body 0.01 % OIL     . hydrocortisone 2.5 % cream Apply 1 application topically 3 (three) times a week.  6  . omeprazole (PRILOSEC) 40 MG capsule Take 40 mg by mouth daily.  3  . sildenafil (REVATIO) 20 MG tablet Take 20 mg by mouth daily as needed.     No current facility-administered medications for this visit.     PHYSICAL EXAMINATION: ECOG PERFORMANCE STATUS: 0 - Asymptomatic  BP 128/80 (BP Location: Right Arm, Patient Position: Sitting)   Pulse 87   Temp (!) 96.7 F (35.9 C) (Tympanic)   Resp (!) 156   Wt 159 lb 6 oz (72.3 kg)   BMI 25.72 kg/m   Filed Weights   04/13/17 1054  Weight: 159 lb 6 oz (72.3 kg)    GENERAL: Well-nourished well-developed; Alert, no distress and comfortable. He is Alone. EYES: no pallor or icterus OROPHARYNX: no thrush or ulceration; good dentition  NECK: supple, no masses felt LYMPH:  no palpable lymphadenopathy in the cervical, axillary or inguinal regions LUNGS: clear to auscultation and  No wheeze or crackles HEART/CVS: regular rate & rhythm and no murmurs; No lower extremity edema ABDOMEN:abdomen soft, non-tender and normal bowel sounds Musculoskeletal:no cyanosis of digits and no clubbing  PSYCH: alert & oriented x 3 with fluent speech NEURO: no focal motor/sensory deficits SKIN:  no rashes or significant lesions   LABORATORY DATA:  I have reviewed the data as listed    Component Value Date/Time   CREATININE 0.93 11/01/2014 1538   GFRNONAA >60 11/01/2014 1538   GFRAA >60 11/01/2014 1538    No results found for: SPEP, UPEP  Lab Results  Component Value Date   WBC 3.0 (L) 01/01/2017   NEUTROABS 1.3 (L) 01/01/2017   HGB 13.0 01/01/2017   HCT 39.1 (L) 01/01/2017   MCV 103.3 (H) 01/01/2017   PLT 666 (H) 01/01/2017      Chemistry      Component Value Date/Time   CREATININE 0.93 11/01/2014 1538   No results found for:  CALCIUM, ALKPHOS, AST, ALT, BILITOT      ASSESSMENT & PLAN:   Essential thrombocythemia ([place]) # LOW RISK Essential thrombocytosis-CALR MUTATION POSITIVE- on anagrelide milligrams twice a day. 03/10/2017- CBC shows platelet count- 543; normal hemoglobin white count.   # Follow-up labs in 2 months follow-up in 4 months.     Cammie Sickle, MD 04/13/2017 1:37 PM

## 2017-06-02 NOTE — Telephone Encounter (Signed)
done

## 2017-06-15 ENCOUNTER — Other Ambulatory Visit: Payer: 59

## 2017-08-17 ENCOUNTER — Inpatient Hospital Stay: Payer: 59 | Attending: Internal Medicine

## 2017-08-17 ENCOUNTER — Inpatient Hospital Stay (HOSPITAL_BASED_OUTPATIENT_CLINIC_OR_DEPARTMENT_OTHER): Payer: 59 | Admitting: Internal Medicine

## 2017-08-17 VITALS — BP 132/86 | HR 99 | Temp 97.3°F | Resp 20 | Ht 60.0 in | Wt 159.0 lb

## 2017-08-17 DIAGNOSIS — Z79899 Other long term (current) drug therapy: Secondary | ICD-10-CM | POA: Diagnosis not present

## 2017-08-17 DIAGNOSIS — Z8051 Family history of malignant neoplasm of kidney: Secondary | ICD-10-CM | POA: Diagnosis not present

## 2017-08-17 DIAGNOSIS — D473 Essential (hemorrhagic) thrombocythemia: Secondary | ICD-10-CM | POA: Insufficient documentation

## 2017-08-17 DIAGNOSIS — Z7982 Long term (current) use of aspirin: Secondary | ICD-10-CM | POA: Insufficient documentation

## 2017-08-17 DIAGNOSIS — Z800 Family history of malignant neoplasm of digestive organs: Secondary | ICD-10-CM | POA: Diagnosis not present

## 2017-08-17 DIAGNOSIS — K219 Gastro-esophageal reflux disease without esophagitis: Secondary | ICD-10-CM | POA: Insufficient documentation

## 2017-08-17 DIAGNOSIS — Z8619 Personal history of other infectious and parasitic diseases: Secondary | ICD-10-CM | POA: Insufficient documentation

## 2017-08-17 DIAGNOSIS — D709 Neutropenia, unspecified: Secondary | ICD-10-CM | POA: Insufficient documentation

## 2017-08-17 DIAGNOSIS — D509 Iron deficiency anemia, unspecified: Secondary | ICD-10-CM | POA: Diagnosis not present

## 2017-08-17 NOTE — Progress Notes (Signed)
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      Essential thrombocythemia [facility])     INTERVAL HISTORY:  51 year old male patient with above history of essential thrombocytosis currently on anagrelide is here for follow-up.    Patient denies any blood lots. Denies any tingling or numbness of burning pain in his hand and feet. Tolerating anagrelide fairly well without any major side effects. He continues to be on asprin.   REVIEW OF SYSTEMS:  A complete 10 point review of system is done which is negative except mentioned above/history of present illness.   PAST MEDICAL HISTORY :  Past Medical History:  Diagnosis Date  . Essential thrombocytosis ([place])   . GERD (gastroesophageal reflux disease)   . Helicobacter pylori ab+    resolved  . IDA (iron deficiency anemia)   . Neutropenia ([place])     PAST SURGICAL HISTORY :   Past Surgical History:  Procedure Laterality Date  . COLONOSCOPY  03/2013  . HIATAL HERNIA REPAIR    . UPPER GI ENDOSCOPY  11/2007    FAMILY HISTORY :   Family History  Problem Relation Age of Onset  . Kidney cancer Father 45       ? liver cancer  . Colon cancer Mother 59    SOCIAL HISTORY:   Social History  Substance Use Topics  . Smoking status: Never Smoker  . Smokeless tobacco: Never Used  . Alcohol use No     Comment: rarely    ALLERGIES:  has No Known Allergies.  MEDICATIONS:  Current Outpatient Prescriptions  Medication Sig Dispense Refill  . anagrelide (AGRYLIN) 1 MG capsule Take 1 capsule (1 mg total) by mouth 2 (two) times daily. 180  capsule 2  . aspirin EC 81 MG tablet Take 1 tablet by mouth daily.    . fexofenadine (ALLEGRA) 180 MG tablet Take 180 mg by mouth daily.     Marland Kitchen HYDROcodone-acetaminophen (NORCO/VICODIN) 5-325 MG tablet Take 1 tablet by mouth every 6 (six) hours as needed. At bedtime    . omeprazole (PRILOSEC) 40 MG capsule Take 40 mg by mouth daily.  3  . Fluocinolone Acetonide Body 0.01 % OIL Apply 1 application topically as needed.     . hydrocortisone 2.5 % cream Apply 1 application topically 3 (three) times a week.  6  . sildenafil (REVATIO) 20 MG tablet Take 20 mg by mouth daily as needed.     No current facility-administered medications for this visit.     PHYSICAL EXAMINATION: ECOG PERFORMANCE STATUS: 0 - Asymptomatic  BP 132/86 (BP Location: Right Arm, Patient Position: Sitting)   Pulse 99   Temp (!) 97.3 F (36.3 C) (Tympanic)   Resp 20   Ht 5' (1.524 m)   Wt 159 lb (72.1 kg)   BMI 31.05 kg/m   Filed Weights   08/17/17 1423  Weight: 159 lb (72.1 kg)    GENERAL: Well-nourished well-developed; Alert, no distress and comfortable. He is Alone. EYES: no pallor or icterus OROPHARYNX: no thrush or ulceration; good dentition  NECK: supple, no masses felt LYMPH:  no palpable lymphadenopathy in the  cervical, axillary or inguinal regions LUNGS: clear to auscultation and  No wheeze or crackles HEART/CVS: regular rate & rhythm and no murmurs; No lower extremity edema ABDOMEN:abdomen soft, non-tender and normal bowel sounds Musculoskeletal:no cyanosis of digits and no clubbing  PSYCH: alert & oriented x 3 with fluent speech NEURO: no focal motor/sensory deficits SKIN:  no rashes or significant lesions   LABORATORY DATA:  I have reviewed the data as listed    Component Value Date/Time   CREATININE 0.93 11/01/2014 1538   GFRNONAA >60 11/01/2014 1538   GFRAA >60 11/01/2014 1538    No results found for: SPEP, UPEP  Lab Results  Component Value Date   WBC 3.0 (L) 01/01/2017   NEUTROABS  1.3 (L) 01/01/2017   HGB 13.0 01/01/2017   HCT 39.1 (L) 01/01/2017   MCV 103.3 (H) 01/01/2017   PLT 666 (H) 01/01/2017      Chemistry      Component Value Date/Time   CREATININE 0.93 11/01/2014 1538   No results found for: CALCIUM, ALKPHOS, AST, ALT, BILITOT      ASSESSMENT & PLAN:   Essential thrombocythemia ([place]) # LOW RISK Essential thrombocytosis-CALR MUTATION POSITIVE- on anagrelide 1 milligrams twice a day. 03/10/2017- CBC shows platelet count- 543; normal hemoglobin white count. Labs from today are pending. Ordered today.   # Follow-up labs in 4 months/[facility];     Cammie Sickle, MD 08/17/2017 2:46 PM

## 2017-08-17 NOTE — Assessment & Plan Note (Signed)
#   LOW RISK Essential thrombocytosis-CALR MUTATION POSITIVE- on anagrelide 1 milligrams twice a day. 03/10/2017- CBC shows platelet count- 543; normal hemoglobin white count. Labs from today are pending. Ordered today.   # Follow-up labs in 4 months/[facility];

## 2017-08-20 ENCOUNTER — Telehealth: Payer: Self-pay | Admitting: *Deleted

## 2017-08-20 NOTE — Telephone Encounter (Signed)
Contacted patient. Pt made aware that his blood counts were reviewed by Dr. Rogue Bussing. plt count 329. Pt made aware to continue current dose of anagrelide. Teach back process performed.

## 2017-08-31 ENCOUNTER — Encounter: Payer: Self-pay | Admitting: Internal Medicine

## 2017-10-03 ENCOUNTER — Encounter: Payer: Self-pay | Admitting: Internal Medicine

## 2017-11-13 ENCOUNTER — Other Ambulatory Visit: Payer: Self-pay

## 2017-11-13 ENCOUNTER — Telehealth: Payer: Self-pay | Admitting: *Deleted

## 2017-11-13 MED ORDER — ANAGRELIDE HCL 1 MG PO CAPS: 1.0000 mg | ORAL_CAPSULE | Freq: Two times a day (BID) | ORAL | 0 refills | Status: DC

## 2017-11-13 MED ORDER — ANAGRELIDE HCL 1 MG PO CAPS: 1.0000 mg | ORAL_CAPSULE | Freq: Two times a day (BID) | ORAL | 1 refills | Status: DC

## 2017-11-13 NOTE — Telephone Encounter (Signed)
Prescriptions have been sent in as directed.

## 2017-11-13 NOTE — Telephone Encounter (Signed)
Patient called to request refill for anagrelide, he has 5 pills left. He would like 20 pills sent to his regular pharmacy and the rest of the 90 day supply to be e-scribed to [facility]. He is worried that the delay in shipping with [facility] will deplete his supply. Please advise.

## 2017-11-20 ENCOUNTER — Other Ambulatory Visit: Payer: Self-pay | Admitting: *Deleted

## 2017-11-20 MED ORDER — ANAGRELIDE HCL 1 MG PO CAPS: 1.0000 mg | ORAL_CAPSULE | Freq: Two times a day (BID) | ORAL | 0 refills | Status: DC

## 2017-12-18 ENCOUNTER — Inpatient Hospital Stay: Payer: 59 | Attending: Internal Medicine | Admitting: Internal Medicine

## 2017-12-18 ENCOUNTER — Other Ambulatory Visit: Payer: Self-pay

## 2017-12-18 VITALS — BP 151/91 | HR 81 | Temp 97.9°F | Resp 20

## 2017-12-18 DIAGNOSIS — Z7982 Long term (current) use of aspirin: Secondary | ICD-10-CM | POA: Insufficient documentation

## 2017-12-18 DIAGNOSIS — D473 Essential (hemorrhagic) thrombocythemia: Secondary | ICD-10-CM | POA: Insufficient documentation

## 2017-12-18 NOTE — Progress Notes (Signed)
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      Essential thrombocythemia [facility])     INTERVAL HISTORY:  52 year old male patient with above history of essential thrombocytosis currently on anagrelide is here for follow-up.   Tolerating anagrelide fairly well without any major side effects. He continues to be on asprin.  He denies any burning pain in his feet.  Denies any strokes.  Denies any swelling in the legs.  REVIEW OF SYSTEMS:  A complete 10 point review of system is done which is negative except mentioned above/history of present illness.   PAST MEDICAL HISTORY :  Past Medical History:  Diagnosis Date  . Essential thrombocytosis ([place])   . GERD (gastroesophageal reflux disease)   . Helicobacter pylori ab+    resolved  . IDA (iron deficiency anemia)   . Neutropenia ([place])     PAST SURGICAL HISTORY :   Past Surgical History:  Procedure Laterality Date  . COLONOSCOPY  03/2013  . HIATAL HERNIA REPAIR    . UPPER GI ENDOSCOPY  11/2007    FAMILY HISTORY :   Family History  Problem Relation Age of Onset  . Kidney cancer Father 31       ? liver cancer  . Colon cancer Mother 71    SOCIAL HISTORY:   Social History   Tobacco Use  . Smoking status: Never Smoker  . Smokeless tobacco: Never Used  Substance Use Topics  . Alcohol use: No    Alcohol/week: 0.0 oz    Comment: rarely  . Drug use: No    ALLERGIES:  has No Known Allergies.  MEDICATIONS:  Current Outpatient Medications  Medication Sig Dispense Refill  . anagrelide (AGRYLIN) 1 MG capsule Take 1 capsule (1  mg total) by mouth 2 (two) times daily. 20 capsule 0  . aspirin EC 81 MG tablet Take 1 tablet by mouth daily.    . fexofenadine (ALLEGRA) 180 MG tablet Take 180 mg by mouth daily.     . Fluocinolone Acetonide Body 0.01 % OIL Apply 1 application topically as needed.     Marland Kitchen HYDROcodone-acetaminophen (NORCO/VICODIN) 5-325 MG tablet Take 1 tablet by mouth every 6 (six) hours as needed. At bedtime    . hydrocortisone 2.5 % cream Apply 1 application topically 3 (three) times a week.  6  . omeprazole (PRILOSEC) 40 MG capsule Take 40 mg by mouth daily.  3  . sildenafil (REVATIO) 20 MG tablet Take 20 mg by mouth daily as needed.     No current facility-administered medications for this visit.     PHYSICAL EXAMINATION: ECOG PERFORMANCE STATUS: 0 - Asymptomatic  BP (!) 151/91   Pulse 81   Temp 97.9 F (36.6 C) (Tympanic)   Resp 20   There were no vitals filed for this visit.  GENERAL: Well-nourished well-developed; Alert, no distress and comfortable. He is Alone. EYES: no pallor or icterus OROPHARYNX: no thrush or ulceration; good dentition  NECK: supple, no masses felt LYMPH:  no palpable lymphadenopathy in the cervical, axillary or inguinal regions LUNGS: clear to auscultation and  No wheeze or crackles HEART/CVS: regular  rate & rhythm and no murmurs; No lower extremity edema ABDOMEN:abdomen soft, non-tender and normal bowel sounds Musculoskeletal:no cyanosis of digits and no clubbing  PSYCH: alert & oriented x 3 with fluent speech NEURO: no focal motor/sensory deficits SKIN:  no rashes or significant lesions   LABORATORY DATA:  I have reviewed the data as listed    Component Value Date/Time   CREATININE 0.93 11/01/2014 1538   GFRNONAA >60 11/01/2014 1538   GFRAA >60 11/01/2014 1538    No results found for: SPEP, UPEP  Lab Results  Component Value Date   WBC 3.0 (L) 01/01/2017   NEUTROABS 1.3 (L) 01/01/2017   HGB 13.0 01/01/2017   HCT 39.1 (L) 01/01/2017   MCV 103.3 (H)  01/01/2017   PLT 666 (H) 01/01/2017      Chemistry      Component Value Date/Time   CREATININE 0.93 11/01/2014 1538   No results found for: CALCIUM, ALKPHOS, AST, ALT, BILITOT      ASSESSMENT & PLAN:   Essential thrombocythemia ([place]) # LOW RISK Essential thrombocytosis-CALR MUTATION POSITIVE- on anagrelide 1 milligrams twice a day. Continue at current dose.  Jan 2019- CBC shows platelet count- 276; normal hemoglobin white count.   # Follow-up labs in 6 months/[facility].     Cammie Sickle, MD 12/18/2017 4:29 PM

## 2017-12-18 NOTE — Assessment & Plan Note (Addendum)
#   LOW RISK Essential thrombocytosis-CALR MUTATION POSITIVE- on anagrelide 1 milligrams twice a day. Continue at current dose.  Jan 2019- CBC shows platelet count- 276; normal hemoglobin white count.   # Follow-up labs in 6 months/[facility].

## 2017-12-30 ENCOUNTER — Encounter: Payer: Self-pay | Admitting: Internal Medicine

## 2018-01-27 DIAGNOSIS — R7303 Prediabetes: Secondary | ICD-10-CM | POA: Insufficient documentation

## 2018-03-23 DIAGNOSIS — I10 Essential (primary) hypertension: Secondary | ICD-10-CM | POA: Insufficient documentation

## 2018-05-17 ENCOUNTER — Other Ambulatory Visit: Payer: Self-pay | Admitting: *Deleted

## 2018-05-17 MED ORDER — ANAGRELIDE HCL 1 MG PO CAPS: 1.0000 mg | ORAL_CAPSULE | Freq: Two times a day (BID) | ORAL | 0 refills | Status: DC

## 2018-05-21 ENCOUNTER — Other Ambulatory Visit: Payer: Self-pay | Admitting: *Deleted

## 2018-05-21 MED ORDER — ANAGRELIDE HCL 1 MG PO CAPS: 1.0000 mg | ORAL_CAPSULE | Freq: Two times a day (BID) | ORAL | 0 refills | Status: DC

## 2018-05-21 NOTE — Telephone Encounter (Signed)
Patient needs a 90 day refill to Mail order for his anagrelide

## 2018-06-18 ENCOUNTER — Inpatient Hospital Stay: Payer: 59 | Admitting: Internal Medicine

## 2018-06-24 ENCOUNTER — Other Ambulatory Visit: Payer: Self-pay | Admitting: Orthopedic Surgery

## 2018-06-24 DIAGNOSIS — M25511 Pain in right shoulder: Secondary | ICD-10-CM

## 2018-07-14 ENCOUNTER — Ambulatory Visit
Admission: RE | Admit: 2018-07-14 | Discharge: 2018-07-14 | Disposition: A | Discharge status: Home / Self Care | Payer: 59 | Source: Ambulatory Visit | Attending: Orthopedic Surgery | Admitting: Orthopedic Surgery

## 2018-07-14 DIAGNOSIS — M25511 Pain in right shoulder: Secondary | ICD-10-CM

## 2018-07-14 DIAGNOSIS — M19011 Primary osteoarthritis, right shoulder: Secondary | ICD-10-CM | POA: Insufficient documentation

## 2018-07-14 MED ORDER — LIDOCAINE HCL (PF) 1 % IJ SOLN
9.0000 mL | Freq: Once | INTRAMUSCULAR | Status: AC
  Administered 2018-07-14: 9 mL
  Filled 2018-07-14: qty 10

## 2018-07-14 MED ORDER — GADOBENATE DIMEGLUMINE 529 MG/ML IV SOLN
0.1000 mL | Freq: Once | INTRAVENOUS | Status: AC | PRN
  Administered 2018-07-14: 0.1 mL via INTRA_ARTICULAR

## 2018-07-14 MED ORDER — IOPAMIDOL (ISOVUE-200) INJECTION 41%
5.0000 mL | Freq: Once | INTRAVENOUS | Status: AC | PRN
  Administered 2018-07-14: 5 mL
  Filled 2018-07-14: qty 50

## 2018-07-16 ENCOUNTER — Ambulatory Visit: Payer: 59 | Admitting: Internal Medicine

## 2018-07-19 ENCOUNTER — Inpatient Hospital Stay: Payer: 59 | Attending: Internal Medicine | Admitting: Internal Medicine

## 2018-07-19 ENCOUNTER — Other Ambulatory Visit: Payer: Self-pay

## 2018-07-19 VITALS — BP 129/85 | HR 71 | Temp 96.7°F | Resp 20 | Ht 60.0 in | Wt 157.9 lb

## 2018-07-19 DIAGNOSIS — D473 Essential (hemorrhagic) thrombocythemia: Secondary | ICD-10-CM | POA: Insufficient documentation

## 2018-07-19 NOTE — Assessment & Plan Note (Addendum)
#   LOW RISK Essential thrombocytosis-CALR MUTATION POSITIVE- on anagrelide 1 milligram twice a day. Continue at current dose.  July 2019- CBC shows platelet count- 679-worse;  normal hemoglobin white count.  Continue current dose of anagrelide.  No concerns for any transformation.   #Recommend compliance with anagrelide.  # Follow-up labs in 6 months/[facility]. In 3 months [facility].

## 2018-07-19 NOTE — Progress Notes (Signed)
[place] OFFICE PROGRESS NOTE  Patient Care Team: Kirk Ruths, MD as PCP - General (Internal Medicine)  Cancer Staging No matching staging information was found for the patient.   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      Essential thrombocythemia ([place])      INTERVAL HISTORY:  Jordan Jefferson 52 y.o.  male pleasant patient above history of low risk essential thrombocytosis on anagrelide is here for follow-up.  Patient appetite is good.  No weight loss.  No blood clots.  No diarrhea swelling in the legs.  Review of Systems  Constitutional: Negative for chills, diaphoresis, fever, malaise/fatigue and weight loss.  HENT: Negative for nosebleeds and sore throat.   Eyes: Negative for double vision.  Respiratory: Negative for cough, hemoptysis, sputum production, shortness of breath and wheezing.   Cardiovascular: Negative for chest pain, palpitations, orthopnea and leg swelling.  Gastrointestinal: Negative for abdominal pain, blood in stool, constipation, diarrhea, heartburn, melena, nausea and vomiting.  Genitourinary: Negative for dysuria, frequency and urgency.  Musculoskeletal: Negative for back pain and joint pain.  Skin: Negative.  Negative for itching and rash.  Neurological: Negative for dizziness, tingling, focal weakness, weakness and headaches.  Endo/Heme/Allergies: Does not bruise/bleed easily.  Psychiatric/Behavioral: Negative for depression. The patient is not nervous/anxious and does not have insomnia.       PAST MEDICAL HISTORY :  Past Medical History:  Diagnosis Date  . Essential thrombocytosis ([place])   . GERD (gastroesophageal reflux disease)   . Helicobacter pylori ab+    resolved  .  IDA (iron deficiency anemia)   . Neutropenia ([place])     PAST SURGICAL HISTORY :   Past Surgical History:  Procedure Laterality Date  . COLONOSCOPY  03/2013  . HIATAL HERNIA REPAIR    . UPPER GI ENDOSCOPY  11/2007    FAMILY HISTORY :   Family History  Problem Relation Age of Onset  . Kidney cancer Father 10       ? liver cancer  . Colon cancer Mother 8    SOCIAL HISTORY:   Social History   Tobacco Use  . Smoking status: Never Smoker  . Smokeless tobacco: Never Used  Substance Use Topics  . Alcohol use: No    Alcohol/week: 0.0 standard drinks    Comment: rarely  . Drug use: No    ALLERGIES:  has No Known Allergies.  MEDICATIONS:  Current Outpatient Medications  Medication Sig Dispense Refill  . anagrelide (AGRYLIN) 1 MG capsule Take 1 capsule (1 mg total) by mouth 2 (two) times daily. 180 capsule 0  . aspirin EC 81 MG tablet Take 1 tablet by mouth daily.    . fexofenadine (ALLEGRA) 180 MG tablet Take 180 mg by mouth daily.     . Fluocinolone Acetonide Body 0.01 % OIL Apply 1 application topically as needed.     Marland Kitchen omeprazole (PRILOSEC) 40 MG capsule Take 40 mg by mouth daily.  3   No current facility-administered medications for this visit.     PHYSICAL EXAMINATION: ECOG PERFORMANCE STATUS: 0 - Asymptomatic  BP 129/85 (Patient Position: Sitting)   Pulse 71   Temp (!) 96.7 F (35.9 C) (Tympanic)   Resp 20   Ht 5' (1.524 m)   Wt  157 lb 14.4 oz (71.6 kg)   BMI 30.84 kg/m   Filed Weights   07/19/18 1013  Weight: 157 lb 14.4 oz (71.6 kg)    GENERAL: Well-nourished well-developed; Alert, no distress and comfortable.  Alone. EYES: no pallor or icterus OROPHARYNX: no thrush or ulceration; NECK: supple; no lymph nodes felt. LYMPH:  no palpable lymphadenopathy in the axillary or inguinal regions LUNGS: Decreased breath sounds auscultation bilaterally. No wheeze or crackles HEART/CVS: regular rate & rhythm and no murmurs; No lower extremity  edema ABDOMEN:abdomen soft, non-tender and normal bowel sounds. No hepatomegaly or splenomegaly.  Musculoskeletal:no cyanosis of digits and no clubbing  PSYCH: alert & oriented x 3 with fluent speech NEURO: no focal motor/sensory deficits SKIN:  no rashes or significant lesions    LABORATORY DATA:  I have reviewed the data as listed    Component Value Date/Time   CREATININE 0.93 11/01/2014 1538   GFRNONAA >60 11/01/2014 1538   GFRAA >60 11/01/2014 1538    No results found for: SPEP, UPEP  Lab Results  Component Value Date   WBC 3.0 (L) 01/01/2017   NEUTROABS 1.3 (L) 01/01/2017   HGB 13.0 01/01/2017   HCT 39.1 (L) 01/01/2017   MCV 103.3 (H) 01/01/2017   PLT 666 (H) 01/01/2017      Chemistry      Component Value Date/Time   CREATININE 0.93 11/01/2014 1538   No results found for: CALCIUM, ALKPHOS, AST, ALT, BILITOT     RADIOGRAPHIC STUDIES: I have personally reviewed the radiological images as listed and agreed with the findings in the report. No results found.   ASSESSMENT & PLAN:  Essential thrombocythemia ([place]) # LOW RISK Essential thrombocytosis-CALR MUTATION POSITIVE- on anagrelide 1 milligram twice a day. Continue at current dose.  July 2019- CBC shows platelet count- 679-worse;  normal hemoglobin white count.  Continue current dose of anagrelide.  No concerns for any transformation.   #Recommend compliance with anagrelide.  # Follow-up labs in 6 months/[facility]. In 3 months [facility].     No orders of the defined types were placed in this encounter.  All questions were answered. The patient knows to call the clinic with any problems, questions or concerns.      Cammie Sickle, MD 07/20/2018 7:39 AM

## 2018-08-31 ENCOUNTER — Ambulatory Visit: Payer: 59 | Admitting: Certified Registered Nurse Anesthetist

## 2018-08-31 ENCOUNTER — Ambulatory Visit
Admission: RE | Admit: 2018-08-31 | Discharge: 2018-08-31 | Disposition: A | Discharge status: Home / Self Care | Payer: 59 | Source: Ambulatory Visit | Attending: Internal Medicine | Admitting: Internal Medicine

## 2018-08-31 ENCOUNTER — Encounter
Admission: RE | Disposition: A | Discharge status: Home / Self Care | Payer: Self-pay | Source: Ambulatory Visit | Attending: Internal Medicine

## 2018-08-31 ENCOUNTER — Encounter: Payer: Self-pay | Admitting: *Deleted

## 2018-08-31 ENCOUNTER — Other Ambulatory Visit: Payer: Self-pay | Admitting: *Deleted

## 2018-08-31 DIAGNOSIS — Z800 Family history of malignant neoplasm of digestive organs: Secondary | ICD-10-CM | POA: Insufficient documentation

## 2018-08-31 DIAGNOSIS — Z7982 Long term (current) use of aspirin: Secondary | ICD-10-CM | POA: Insufficient documentation

## 2018-08-31 DIAGNOSIS — K219 Gastro-esophageal reflux disease without esophagitis: Secondary | ICD-10-CM | POA: Diagnosis not present

## 2018-08-31 DIAGNOSIS — D473 Essential (hemorrhagic) thrombocythemia: Secondary | ICD-10-CM | POA: Insufficient documentation

## 2018-08-31 DIAGNOSIS — Z1211 Encounter for screening for malignant neoplasm of colon: Secondary | ICD-10-CM | POA: Diagnosis present

## 2018-08-31 HISTORY — PX: COLONOSCOPY WITH PROPOFOL: SHX5780

## 2018-08-31 SURGERY — COLONOSCOPY WITH PROPOFOL
Anesthesia: General

## 2018-08-31 MED ORDER — PROPOFOL 500 MG/50ML IV EMUL
INTRAVENOUS | Status: DC | PRN
  Administered 2018-08-31: 140 ug/kg/min via INTRAVENOUS

## 2018-08-31 MED ORDER — MIDAZOLAM HCL 2 MG/2ML IJ SOLN
INTRAMUSCULAR | Status: DC | PRN
  Administered 2018-08-31: 2 mg via INTRAVENOUS

## 2018-08-31 MED ORDER — SODIUM CHLORIDE 0.9 % IV SOLN
INTRAVENOUS | Status: DC
  Administered 2018-08-31: 09:00:00 via INTRAVENOUS

## 2018-08-31 MED ORDER — LIDOCAINE HCL (PF) 2 % IJ SOLN
INTRAMUSCULAR | Status: AC
  Filled 2018-08-31: qty 10

## 2018-08-31 MED ORDER — LIDOCAINE HCL (CARDIAC) PF 100 MG/5ML IV SOSY
PREFILLED_SYRINGE | INTRAVENOUS | Status: DC | PRN
  Administered 2018-08-31: 50 mg via INTRAVENOUS

## 2018-08-31 MED ORDER — PROPOFOL 10 MG/ML IV BOLUS
INTRAVENOUS | Status: DC | PRN
  Administered 2018-08-31: 100 mg via INTRAVENOUS

## 2018-08-31 MED ORDER — MIDAZOLAM HCL 2 MG/2ML IJ SOLN
INTRAMUSCULAR | Status: AC
  Filled 2018-08-31: qty 2

## 2018-08-31 MED ORDER — PROPOFOL 500 MG/50ML IV EMUL
INTRAVENOUS | Status: AC
  Filled 2018-08-31: qty 50

## 2018-08-31 MED ORDER — ANAGRELIDE HCL 1 MG PO CAPS: 1.0000 mg | ORAL_CAPSULE | Freq: Two times a day (BID) | ORAL | 1 refills | Status: DC

## 2018-08-31 NOTE — Transfer of Care (Signed)
Immediate Anesthesia Transfer of Care Note  Patient: Jordan Jefferson  Procedure(s) Performed: COLONOSCOPY WITH PROPOFOL (N/A )  Patient Location: PACU and Endoscopy Unit  Anesthesia Type:General  Level of Consciousness: drowsy  Airway & Oxygen Therapy: Patient Spontanous Breathing and Patient connected to nasal cannula oxygen  Post-op Assessment: Report given to RN and Post -op Vital signs reviewed and stable  Post vital signs: Reviewed and stable  Last Vitals:  Vitals Value Taken Time  BP 93/80 08/31/2018  9:42 AM  Temp    Pulse 78 08/31/2018  9:42 AM  Resp 15 08/31/2018  9:42 AM  SpO2 99 % 08/31/2018  9:42 AM  Vitals shown include unvalidated device data.  Last Pain:  Vitals:   08/31/18 0832  TempSrc: Tympanic  PainSc: 0-No pain         Complications: No apparent anesthesia complications

## 2018-08-31 NOTE — Op Note (Signed)
[facility] Gastroenterology Patient Name: Jordan Jefferson Procedure Date: 08/31/2018 9:25 AM MRN: [id number] Account #: [id number] Date of Birth: 09/22/1966 Admit Type: Outpatient Age: 52 Room: [facility] ENDO ROOM 2 Gender: Male Note Status: Finalized Procedure:            Colonoscopy Indications:          Screening in patient at increased risk: Family history                        of 1st-degree relative with colorectal cancer before                        age 80 years Providers:            Benay Pike. Alice Reichert MD, MD Referring MD:         Ocie Cornfield. Ouida Sills MD, MD (Referring MD) Medicines:            Propofol per Anesthesia Complications:        No immediate complications. Procedure:            Pre-Anesthesia Assessment:                       - The risks and benefits of the procedure and the                        sedation options and risks were discussed with the                        patient. All questions were answered and informed                        consent was obtained.                       - Patient identification and proposed procedure were                        verified prior to the procedure by the nurse. The                        procedure was verified in the procedure room.                       - ASA Grade Assessment: II - A patient with mild                        systemic disease.                       - After reviewing the risks and benefits, the patient                        was deemed in satisfactory condition to undergo the                        procedure.                       After obtaining informed consent, the colonoscope was  passed under direct vision. Throughout the procedure,                        the patient's blood pressure, pulse, and oxygen                        saturations were monitored continuously. The                        Colonoscope was introduced through the anus and   advanced to the the cecum, identified by appendiceal                        orifice and ileocecal valve. The colonoscopy was                        performed without difficulty. The patient tolerated the                        procedure well. The quality of the bowel preparation                        was excellent. The ileocecal valve, appendiceal                        orifice, and rectum were photographed. Findings:      The perianal and digital rectal examinations were normal. Pertinent       negatives include normal sphincter tone and no palpable rectal lesions.      The colon (entire examined portion) appeared normal.      The exam was otherwise without abnormality on direct and retroflexion       views. Impression:           - The entire examined colon is normal.                       - The examination was otherwise normal on direct and                        retroflexion views.                       - No specimens collected. Recommendation:       - Patient has a contact number available for                        emergencies. The signs and symptoms of potential                        delayed complications were discussed with the patient.                        Return to normal activities tomorrow. Written discharge                        instructions were provided to the patient.                       - Resume previous diet.                       -  Continue present medications.                       - Repeat colonoscopy in 5 years for screening purposes.                       - The findings and recommendations were discussed with                        the patient and their spouse.                       - Return to GI office PRN. Procedure Code(s):    --- Professional ---                       Q7622, Colorectal cancer screening; colonoscopy on                        individual at high risk Diagnosis Code(s):    --- Professional ---                       Z80.0, Family history of  malignant neoplasm of                        digestive organs CPT copyright 2017 American Medical Association. All rights reserved. The codes documented in this report are preliminary and upon coder review may  be revised to meet current compliance requirements. Efrain Sella MD, MD 08/31/2018 9:41:49 AM This report has been signed electronically. Number of Addenda: 0 Note Initiated On: 08/31/2018 9:25 AM Scope Withdrawal Time: 0 hours 5 minutes 56 seconds  Total Procedure Duration: 0 hours 8 minutes 47 seconds       [facility]

## 2018-08-31 NOTE — Anesthesia Postprocedure Evaluation (Signed)
Anesthesia Post Note  Patient: Jordan Jefferson  Procedure(s) Performed: COLONOSCOPY WITH PROPOFOL (N/A )  Patient location during evaluation: Endoscopy Anesthesia Type: General Level of consciousness: awake and alert Pain management: pain level controlled Vital Signs Assessment: post-procedure vital signs reviewed and stable Respiratory status: spontaneous breathing, nonlabored ventilation, respiratory function stable and patient connected to nasal cannula oxygen Cardiovascular status: blood pressure returned to baseline and stable Postop Assessment: no apparent nausea or vomiting Anesthetic complications: no     Last Vitals:  Vitals:   08/31/18 0952 08/31/18 1002  BP: 109/83 (!) 133/93  Pulse: 76 72  Resp: 17 16  Temp:    SpO2: 98% 100%    Last Pain:  Vitals:   08/31/18 1002  TempSrc:   PainSc: 0-No pain                 Precious Haws Piscitello

## 2018-08-31 NOTE — Interval H&P Note (Signed)
History and Physical Interval Note:  08/31/2018 9:18 AM  Jordan Jefferson  has presented today for surgery, with the diagnosis of FAMILY HX.OF COLON CANCER  The various methods of treatment have been discussed with the patient and family. After consideration of risks, benefits and other options for treatment, the patient has consented to  Procedure(s): COLONOSCOPY WITH PROPOFOL (N/A) as a surgical intervention .  The patient's history has been reviewed, patient examined, no change in status, stable for surgery.  I have reviewed the patient's chart and labs.  Questions were answered to the patient's satisfaction.     [place], [place]

## 2018-08-31 NOTE — Anesthesia Post-op Follow-up Note (Signed)
Anesthesia QCDR form completed.        

## 2018-08-31 NOTE — H&P (Signed)
Outpatient short stay form Pre-procedure 08/31/2018 9:15 AM Jordan Jefferson K. Alice Reichert, M.D.  Primary Physician: Frazier Richards, M.D.  Reason for visit: Family history of colon cancer  History of present illness:  Patient has a family hx of colon cancer. Patient presents for colonoscopy for colon cancer screening. The patient denies complaints of abdominal pain, significant change in bowel habits, or rectal bleeding.     Current Facility-Administered Medications:  .  0.9 %  sodium chloride infusion, , Intravenous, Continuous, Aslan Montagna, Benay Pike, MD  Medications Prior to Admission  Medication Sig Dispense Refill Last Dose  . anagrelide (AGRYLIN) 1 MG capsule Take 1 capsule (1 mg total) by mouth 2 (two) times daily. 180 capsule 0 Past Week at Unknown time  . aspirin EC 81 MG tablet Take 1 tablet by mouth daily.   Past Week at Unknown time  . fexofenadine (ALLEGRA) 180 MG tablet Take 180 mg by mouth daily.    Past Week at Unknown time  . Fluocinolone Acetonide Body 0.01 % OIL Apply 1 application topically as needed.    Past Week at Unknown time  . omeprazole (PRILOSEC) 40 MG capsule Take 40 mg by mouth daily.  3 Past Week at Unknown time     No Known Allergies   Past Medical History:  Diagnosis Date  . Essential thrombocytosis ([place])   . GERD (gastroesophageal reflux disease)   . Helicobacter pylori ab+    resolved  . IDA (iron deficiency anemia)   . Neutropenia ([place])     Review of systems:  Otherwise negative.    Physical Exam  Gen: Alert, oriented. Appears stated age.  HEENT: [place]/AT. PERRLA. Lungs: CTA, no wheezes. CV: RR nl S1, S2. Abd: soft, benign, no masses. BS+ Ext: No edema. Pulses 2+    Planned procedures: Proceed with colonoscopy. The patient understands the nature of the planned procedure, indications, risks, alternatives and potential complications including but not limited to bleeding, infection, perforation, damage to internal organs and possible oversedation/side  effects from anesthesia. The patient agrees and gives consent to proceed.  Please refer to procedure notes for findings, recommendations and patient disposition/instructions.     Jordan Jefferson K. Alice Reichert, M.D. Gastroenterology 08/31/2018  9:15 AM

## 2018-08-31 NOTE — Anesthesia Preprocedure Evaluation (Signed)
Anesthesia Evaluation  Patient identified by MRN, date of birth, ID band Patient awake    Reviewed: Allergy & Precautions, H&P , NPO status , Patient's Chart, lab work & pertinent test results  History of Anesthesia Complications Negative for: history of anesthetic complications  Airway Mallampati: II  TM Distance: >3 FB Neck ROM: full    Dental  (+) Chipped   Pulmonary neg pulmonary ROS, neg shortness of breath,           Cardiovascular Exercise Tolerance: Good (-) angina(-) Past MI negative cardio ROS       Neuro/Psych negative neurological ROS  negative psych ROS   GI/Hepatic Neg liver ROS, GERD  Medicated and Controlled,  Endo/Other  negative endocrine ROS  Renal/GU negative Renal ROS  negative genitourinary   Musculoskeletal   Abdominal   Peds  Hematology negative hematology ROS (+)   Anesthesia Other Findings Past Medical History: No date: Essential thrombocytosis ([facility]) No date: GERD (gastroesophageal reflux disease) No date: Helicobacter pylori ab+     Comment:  resolved No date: IDA (iron deficiency anemia) No date: Neutropenia ([place])  Past Surgical History: 03/2013: COLONOSCOPY No date: HIATAL HERNIA REPAIR 11/2007: UPPER GI ENDOSCOPY  BMI    Body Mass Index:  25.50 kg/m      Reproductive/Obstetrics negative OB ROS                             Anesthesia Physical Anesthesia Plan  ASA: III  Anesthesia Plan: General   Post-op Pain Management:    Induction: Intravenous  PONV Risk Score and Plan: Propofol infusion and TIVA  Airway Management Planned: Natural Airway and Nasal Cannula  Additional Equipment:   Intra-op Plan:   Post-operative Plan:   Informed Consent: I have reviewed the patients History and Physical, chart, labs and discussed the procedure including the risks, benefits and alternatives for the proposed anesthesia with the patient or authorized  representative who has indicated his/her understanding and acceptance.   Dental Advisory Given  Plan Discussed with: Anesthesiologist, CRNA and Surgeon  Anesthesia Plan Comments: (Patient consented for risks of anesthesia including but not limited to:  - adverse reactions to medications - risk of intubation if required - damage to teeth, lips or other oral mucosa - sore throat or hoarseness - Damage to heart, brain, lungs or loss of life  Patient voiced understanding.)        Anesthesia Quick Evaluation

## 2018-09-01 ENCOUNTER — Encounter: Payer: Self-pay | Admitting: Internal Medicine

## 2018-10-04 ENCOUNTER — Encounter: Payer: Self-pay | Admitting: Internal Medicine

## 2019-01-07 ENCOUNTER — Encounter: Payer: Self-pay | Admitting: Internal Medicine

## 2019-01-17 ENCOUNTER — Inpatient Hospital Stay: Payer: 59 | Admitting: Internal Medicine

## 2019-01-19 ENCOUNTER — Other Ambulatory Visit: Payer: Self-pay

## 2019-01-19 ENCOUNTER — Other Ambulatory Visit: Payer: Self-pay | Admitting: Internal Medicine

## 2019-01-19 ENCOUNTER — Inpatient Hospital Stay: Payer: 59 | Attending: Internal Medicine | Admitting: Internal Medicine

## 2019-01-19 ENCOUNTER — Encounter: Payer: Self-pay | Admitting: Internal Medicine

## 2019-01-19 VITALS — BP 139/85 | HR 68 | Temp 97.8°F | Resp 20 | Ht 66.0 in | Wt 153.0 lb

## 2019-01-19 DIAGNOSIS — I10 Essential (primary) hypertension: Secondary | ICD-10-CM | POA: Diagnosis not present

## 2019-01-19 DIAGNOSIS — Z7189 Other specified counseling: Secondary | ICD-10-CM | POA: Insufficient documentation

## 2019-01-19 DIAGNOSIS — D473 Essential (hemorrhagic) thrombocythemia: Secondary | ICD-10-CM | POA: Insufficient documentation

## 2019-01-19 DIAGNOSIS — R519 Headache, unspecified: Secondary | ICD-10-CM

## 2019-01-19 DIAGNOSIS — R51 Headache: Principal | ICD-10-CM

## 2019-01-19 MED ORDER — ANAGRELIDE HCL 1 MG PO CAPS: 1.0000 mg | ORAL_CAPSULE | Freq: Two times a day (BID) | ORAL | 1 refills | Status: DC

## 2019-01-19 NOTE — Progress Notes (Signed)
[place] OFFICE PROGRESS NOTE  Patient Care Team: Kirk Ruths, MD as PCP - General (Internal Medicine)  Cancer Staging No matching staging information was found for the patient.   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    DIAGNOSIS: Essential thrombocytosis  STAGE:  Low risk       ;GOALS: control  CURRENT/MOST RECENT THERAPY: Anagrelide     Essential thrombocythemia ([place])      INTERVAL HISTORY:  Jordan FEDEWA 53 y.o.  male pleasant patient above history of low risk essential thrombocytosis on anagrelide is here for follow-up.  Patient denies any blood clots.  Denies any weight loss.  No nausea or vomiting.  Denies any burning pain in fingertips and toes.  Review of Systems  Constitutional: Negative for chills, diaphoresis, fever, malaise/fatigue and weight loss.  HENT: Negative for nosebleeds and sore throat.   Eyes: Negative for double vision.  Respiratory: Negative for cough, hemoptysis, sputum production, shortness of breath and wheezing.   Cardiovascular: Negative for chest pain, palpitations, orthopnea and leg swelling.  Gastrointestinal: Negative for abdominal pain, blood in stool, constipation, diarrhea, heartburn, melena, nausea and vomiting.  Genitourinary: Negative for dysuria, frequency and urgency.  Musculoskeletal: Negative for back pain and joint pain.  Skin: Negative.  Negative for itching and rash.  Neurological: Negative for dizziness, tingling, focal weakness, weakness and headaches.  Endo/Heme/Allergies: Does not bruise/bleed easily.  Psychiatric/Behavioral: Negative for depression. The patient is not nervous/anxious and does not have insomnia.       PAST MEDICAL HISTORY :  Past  Medical History:  Diagnosis Date  . Essential thrombocytosis ([place])   . GERD (gastroesophageal reflux disease)   . Helicobacter pylori ab+    resolved  . IDA (iron deficiency anemia)   . Neutropenia ([place])     PAST SURGICAL HISTORY :   Past Surgical History:  Procedure Laterality Date  . COLONOSCOPY  03/2013  . COLONOSCOPY WITH PROPOFOL N/A 08/31/2018   Procedure: COLONOSCOPY WITH PROPOFOL;  Surgeon: [place], Benay Pike, MD;  Location: [facility] ENDOSCOPY;  Service: Gastroenterology;  Laterality: N/A;  . HIATAL HERNIA REPAIR    . UPPER GI ENDOSCOPY  11/2007    FAMILY HISTORY :   Family History  Problem Relation Age of Onset  . Kidney cancer Father 15       ? liver cancer  . Colon cancer Mother 19    SOCIAL HISTORY:   Social History   Tobacco Use  . Smoking status: Never Smoker  . Smokeless tobacco: Never Used  Substance Use Topics  . Alcohol use: No    Alcohol/week: 0.0 standard drinks    Comment: rarely  . Drug use: No    ALLERGIES:  has No Known Allergies.  MEDICATIONS:  Current Outpatient Medications  Medication Sig Dispense Refill  . anagrelide (AGRYLIN) 1 MG capsule Take 1 capsule (1 mg total) by mouth 2 (two) times daily. 180 capsule 1  . aspirin EC 81 MG tablet Take 1 tablet by mouth daily.    . fexofenadine (ALLEGRA) 180 MG tablet Take 180 mg by mouth daily.     . Fluocinolone Acetonide Body 0.01 % OIL Apply 1 application topically as needed.     Marland Kitchen omeprazole (PRILOSEC) 40 MG capsule Take 40 mg by  mouth daily.  3   No current facility-administered medications for this visit.     PHYSICAL EXAMINATION: ECOG PERFORMANCE STATUS: 0 - Asymptomatic  BP 139/85 (BP Location: Left Arm, Patient Position: Sitting)   Pulse 68   Temp 97.8 F (36.6 C) (Tympanic)   Resp 20   Ht 5\' 6"  (1.676 m)   Wt 153 lb (69.4 kg)   BMI 24.69 kg/m   Filed Weights   01/19/19 1130  Weight: 153 lb (69.4 kg)    Physical Exam  Constitutional: He is oriented to person, place, and  time and well-developed, well-nourished, and in no distress.  HENT:  Head: Normocephalic and atraumatic.  Mouth/Throat: Oropharynx is clear and moist. No oropharyngeal exudate.  Eyes: Pupils are equal, round, and reactive to light.  Neck: Normal range of motion. Neck supple.  Cardiovascular: Normal rate and regular rhythm.  Pulmonary/Chest: No respiratory distress. He has no wheezes.  Abdominal: Soft. Bowel sounds are normal. He exhibits no distension and no mass. There is no abdominal tenderness. There is no rebound and no guarding.  Musculoskeletal: Normal range of motion.        General: No tenderness or edema.  Neurological: He is alert and oriented to person, place, and time.  Skin: Skin is warm.  Psychiatric: Affect normal.      LABORATORY DATA:  I have reviewed the data as listed    Component Value Date/Time   CREATININE 0.93 11/01/2014 1538   GFRNONAA >60 11/01/2014 1538   GFRAA >60 11/01/2014 1538    No results found for: SPEP, UPEP  Lab Results  Component Value Date   WBC 3.0 (L) 01/01/2017   NEUTROABS 1.3 (L) 01/01/2017   HGB 13.0 01/01/2017   HCT 39.1 (L) 01/01/2017   MCV 103.3 (H) 01/01/2017   PLT 666 (H) 01/01/2017      Chemistry      Component Value Date/Time   CREATININE 0.93 11/01/2014 1538   No results found for: CALCIUM, ALKPHOS, AST, ALT, BILITOT     RADIOGRAPHIC STUDIES: I have personally reviewed the radiological images as listed and agreed with the findings in the report. No results found.   ASSESSMENT & PLAN:  Essential thrombocythemia ([place]) # LOW RISK Essential thrombocytosis-CALR MUTATION POSITIVE- on anagrelide 1 milligram twice a day  # Feb 2020/[facility]- normal white count/hemoglobin stable; platelets 409.  Continue anagrelide.  New prescription given.  #Hypertension better controlled./Stable.  # DISPOSITION: # Follow-up labs in 6 months-MD /l1 week prior- [facility]/cmp/ldh-Dr.B    No orders of the defined types were placed  in this encounter.  All questions were answered. The patient knows to call the clinic with any problems, questions or concerns.      Cammie Sickle, MD 01/19/2019 8:10 PM

## 2019-01-19 NOTE — Assessment & Plan Note (Addendum)
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  Continue anagrelide.  New prescription given.  #Hypertension better controlled./Stable.  # DISPOSITION: # Follow-up labs in 6 months-MD /l1 week prior- [facility]/cmp/ldh-Dr.B  

## 2019-02-07 ENCOUNTER — Ambulatory Visit
Admission: RE | Admit: 2019-02-07 | Discharge: 2019-02-07 | Disposition: A | Discharge status: Home / Self Care | Payer: 59 | Source: Ambulatory Visit | Attending: Internal Medicine | Admitting: Internal Medicine

## 2019-02-07 ENCOUNTER — Other Ambulatory Visit: Payer: Self-pay

## 2019-02-07 DIAGNOSIS — R51 Headache: Secondary | ICD-10-CM | POA: Insufficient documentation

## 2019-02-07 DIAGNOSIS — R519 Headache, unspecified: Secondary | ICD-10-CM

## 2019-03-07 ENCOUNTER — Other Ambulatory Visit: Payer: Self-pay | Admitting: *Deleted

## 2019-03-07 MED ORDER — ANAGRELIDE HCL 1 MG PO CAPS: 1.0000 mg | ORAL_CAPSULE | Freq: Two times a day (BID) | ORAL | 1 refills | Status: DC

## 2019-03-15 ENCOUNTER — Other Ambulatory Visit: Payer: Self-pay | Admitting: *Deleted

## 2019-03-15 MED ORDER — ANAGRELIDE HCL 1 MG PO CAPS: 1.0000 mg | ORAL_CAPSULE | Freq: Two times a day (BID) | ORAL | 1 refills | Status: DC

## 2019-05-06 ENCOUNTER — Telehealth: Payer: Self-pay | Admitting: *Deleted

## 2019-05-06 ENCOUNTER — Other Ambulatory Visit: Payer: Self-pay | Admitting: *Deleted

## 2019-05-06 MED ORDER — ANAGRELIDE HCL 1 MG PO CAPS: 1.0000 mg | ORAL_CAPSULE | Freq: Two times a day (BID) | ORAL | 0 refills | Status: DC

## 2019-05-06 NOTE — Telephone Encounter (Signed)
Patient called. vm transferred from triage to clinical line. He is out of town in [place]. Patient requesting 4 tablets of Agrylin to [facility] [place], [place] on [address]  I spoke with Dr. Rogue Bussing - v/o to script sent to this pharmacy per md order. Pt updated and made aware that script sent to pharmacy.

## 2019-06-08 ENCOUNTER — Other Ambulatory Visit: Payer: Self-pay | Admitting: *Deleted

## 2019-06-08 MED ORDER — ANAGRELIDE HCL 1 MG PO CAPS: 1.0000 mg | ORAL_CAPSULE | Freq: Two times a day (BID) | ORAL | 3 refills | Status: DC

## 2019-07-07 ENCOUNTER — Other Ambulatory Visit: Payer: Self-pay | Admitting: Internal Medicine

## 2019-07-07 DIAGNOSIS — R112 Nausea with vomiting, unspecified: Secondary | ICD-10-CM

## 2019-07-19 ENCOUNTER — Encounter: Payer: Self-pay | Admitting: *Deleted

## 2019-07-19 ENCOUNTER — Other Ambulatory Visit: Payer: Self-pay

## 2019-07-19 IMAGING — MR MRI HEAD WITHOUT CONTRAST
13 series · 42 of 48 positions shown · non-contrast
Comparison: None.

CLINICAL DATA: Daily headaches. Retro-orbital pain and nausea for 2
weeks.

EXAM:
MRI HEAD WITHOUT CONTRAST
TECHNIQUE: Multiplanar, multiecho pulse sequences of the brain and surrounding
structures were obtained without intravenous contrast.

[Series 5: ax dwi_tracew · axial · 3.0mm · 0.60mm/px · z∈[-55,+105]mm · 3 of 55 slices shown (1 of 2)]
[im 1/55]
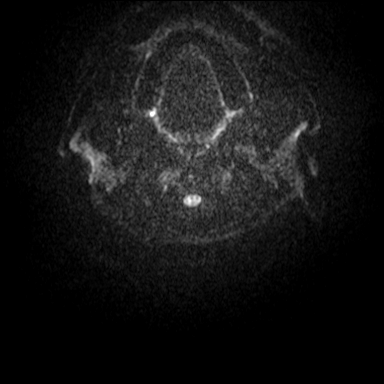
[im 28/55]
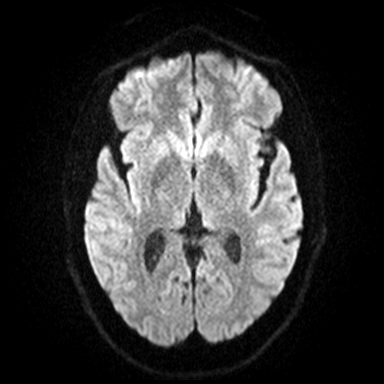
[im 55/55]
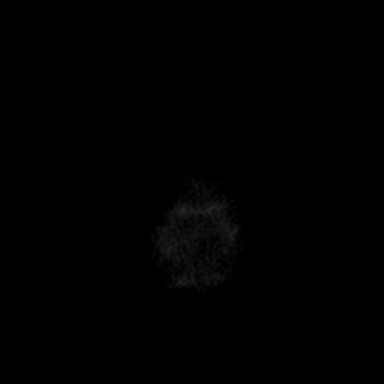

[Series 5: ax dwi_tracew · axial · 3.0mm · 0.60mm/px · z∈[-55,+105]mm · 3 of 55 slices shown (2 of 2)]
[im 1/55]
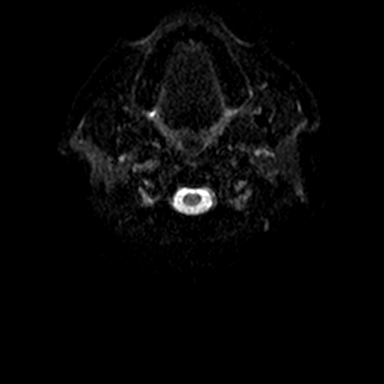
[im 28/55]
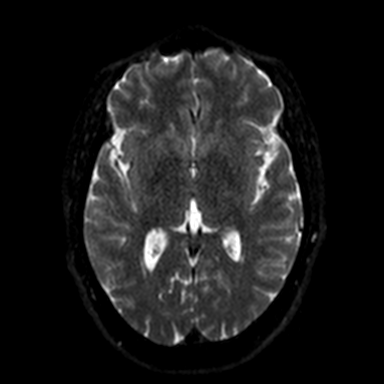
[im 55/55]
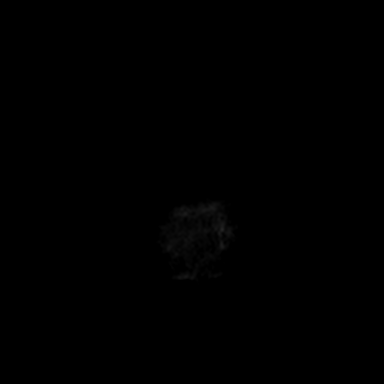

[Series 6: ax dwi_adc · axial · 3.0mm · 0.60mm/px · z∈[-55,+105]mm · 3 of 55 slices shown]
[im 1/55]
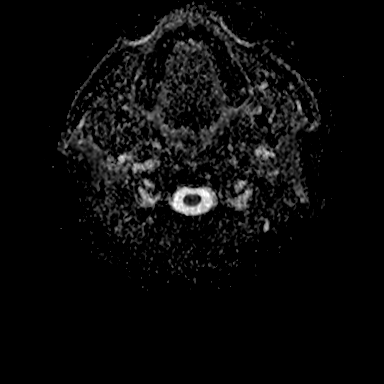
[im 28/55]
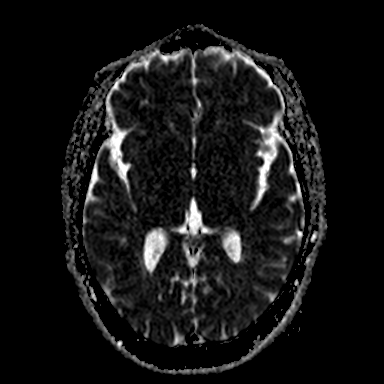
[im 55/55]
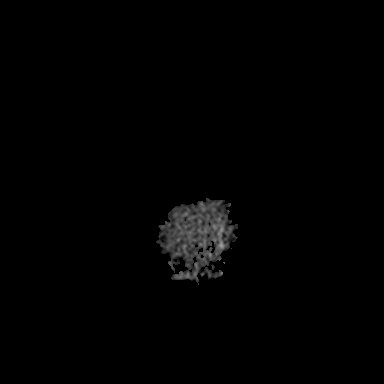

[Series 7: cor dwi_tracew · coronal · 5.0mm · 0.60mm/px · 3 of 45 slices shown]
[im 1/45]
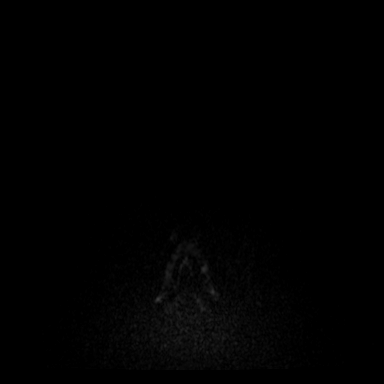
[im 23/45]
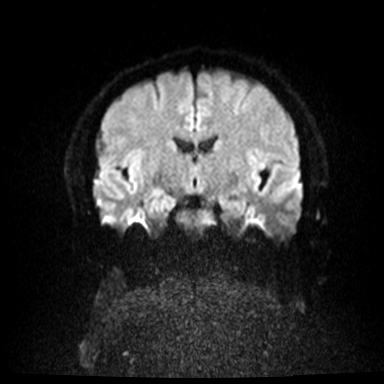
[im 45/45]
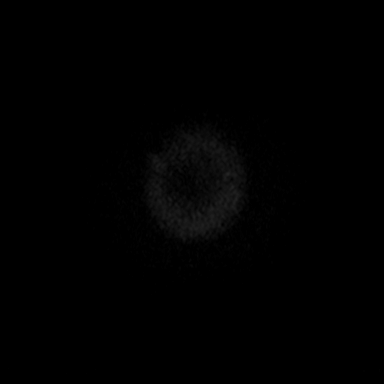

[Series 8: cor dwi_adc · coronal · 5.0mm · 0.60mm/px · 3 of 45 slices shown]
[im 1/45]
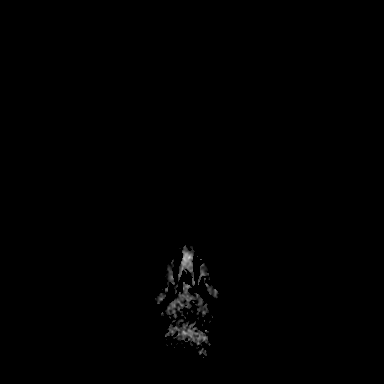
[im 23/45]
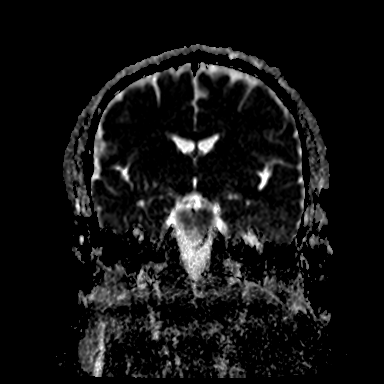
[im 45/45]
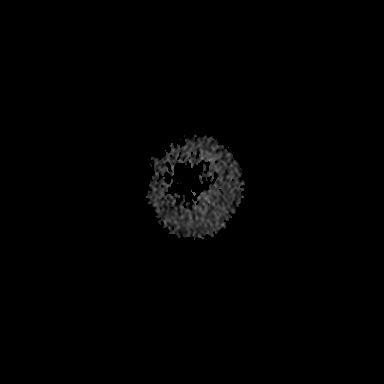

[Series 9: T1 · sagittal · 5.0mm · 0.62mm/px · 2 of 24 slices shown (1 of 2)]
[im 1/24]
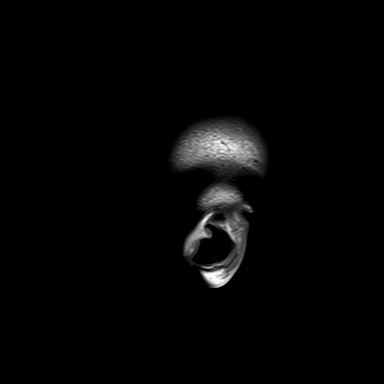
[im 24/24]
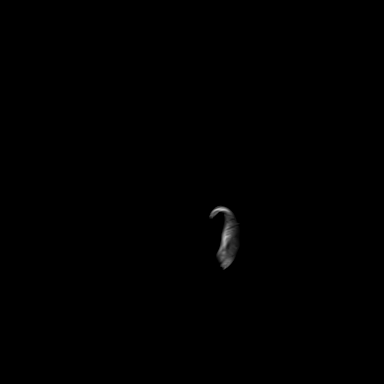

[Series 10: T2 · axial · 5.0mm · 0.53mm/px · z∈[-47,+107]mm · 2 of 27 slices shown (1 of 2)]
[im 1/27]
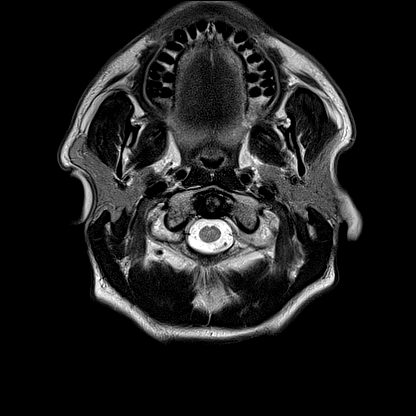
[im 27/27]
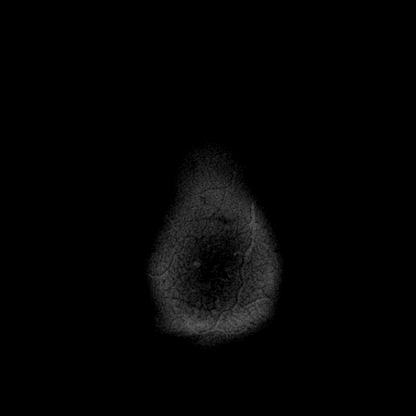

[Series 11: mag_images · axial · 3.0mm · 0.90mm/px · z∈[-59,+117]mm · 4 of 60 slices shown]
[im 1/60]
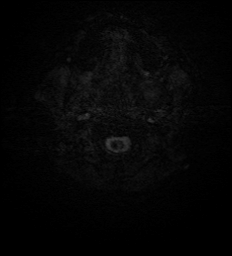
[im 20/60]
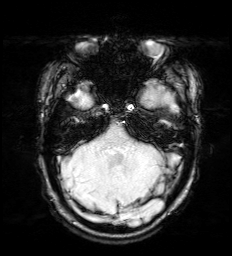
[im 40/60]
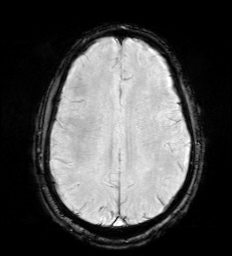
[im 60/60]
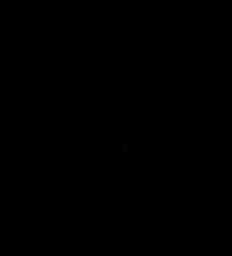

[Series 12: pha_images · axial · 3.0mm · 0.90mm/px · z∈[-59,+114]mm · 4 of 59 slices shown]
[im 1/59]
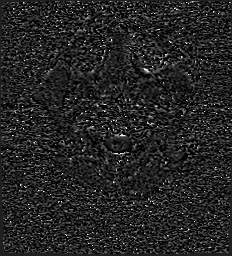
[im 20/59]
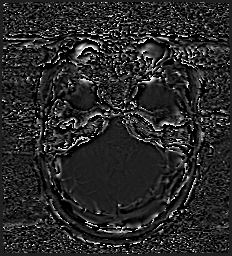
[im 39/59]
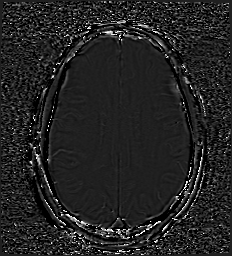
[im 59/59]
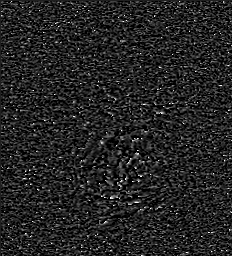

[Series 13: swi_images · axial · 3.0mm · 0.90mm/px · 1 of 60 slices shown]
[im 1/60]
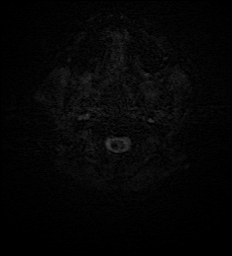

[Series 15: FLAIR · axial · 3.0mm · 0.53mm/px · z∈[-50,+110]mm · 4 of 55 slices shown]
[im 1/55]
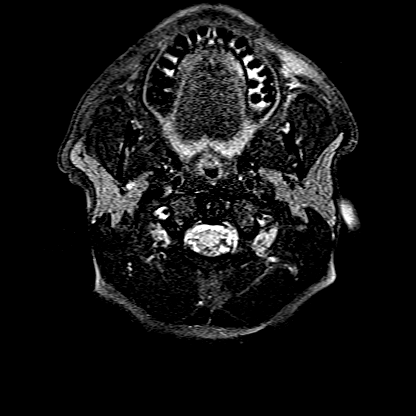
[im 19/55]
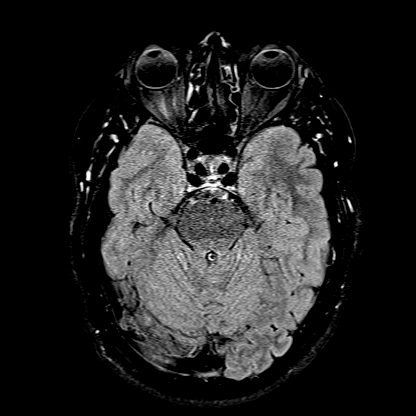
[im 37/55]
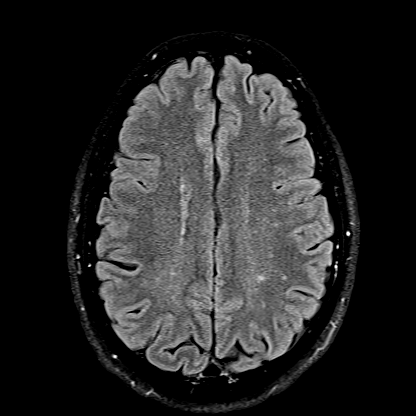
[im 55/55]
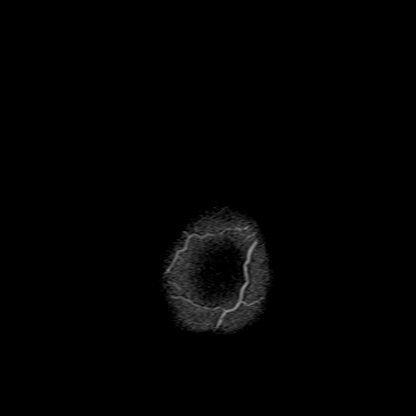

[Series 16: T1 · axial · 1.0mm · 0.98mm/px · z∈[-58,+116]mm · 8 of 176 slices shown (2 of 2)]
[im 1/176]
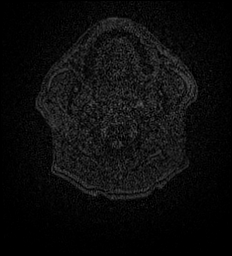
[im 36/176]
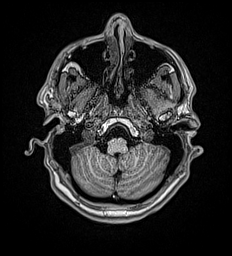
[im 53/176]
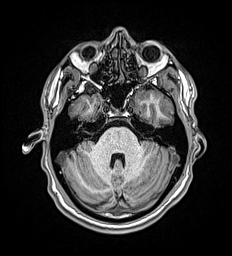
[im 71/176]
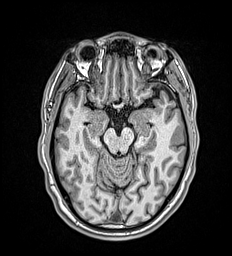
[im 106/176]
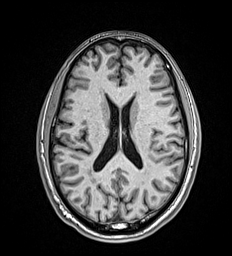
[im 123/176]
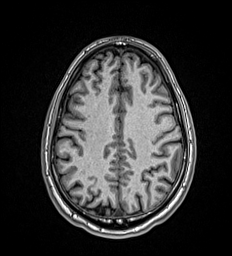
[im 141/176]
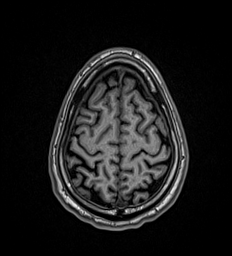
[im 176/176]
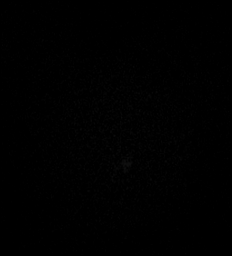

[Series 18: T2 · coronal · 5.0mm · 0.57mm/px · 2 of 31 slices shown (2 of 2)]
[im 1/31]
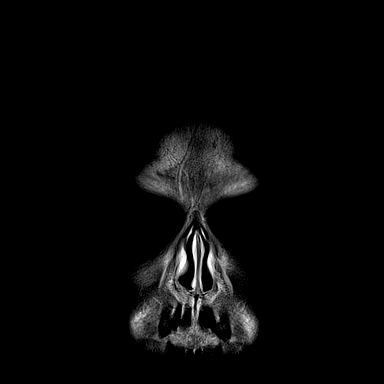
[im 31/31]
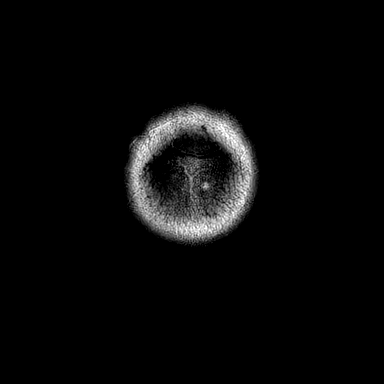

[42 of 48 positions shown; findings below may reference images not displayed]

FINDINGS: Brain: Periventricular and subcortical T2 hyperintensities
bilaterally are mildly advanced for age. No acute infarct,
hemorrhage, or mass lesion is present. The ventricles are of normal
size. No significant extraaxial fluid collection is present.

Vascular: Flow is present in the major intracranial arteries.

Skull and upper cervical spine: The craniocervical junction is
normal. Upper cervical spine is within normal limits. Marrow signal
is unremarkable.

Sinuses/Orbits: The paranasal sinuses and mastoid air cells are
clear. The globes and orbits are within normal limits.
IMPRESSION: 1. Periventricular and subcortical T2 hyperintensities are mildly
advanced for age. The finding is nonspecific but can be seen in the
setting of chronic microvascular ischemia, a demyelinating process
such as multiple sclerosis, vasculitis, complicated migraine
headaches, or as the sequelae of a prior infectious or inflammatory
process.
2. No acute intracranial abnormality. Specifically, no significant
sinus disease.

## 2019-07-20 ENCOUNTER — Inpatient Hospital Stay: Payer: 59 | Admitting: Internal Medicine

## 2019-07-20 NOTE — Assessment & Plan Note (Deleted)
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  Continue anagrelide.  New prescription given.  #Hypertension better controlled./Stable.  # DISPOSITION: # Follow-up labs in 6 months-MD /l1 week prior- [facility]/cmp/ldh-Dr.B

## 2019-07-20 NOTE — Progress Notes (Deleted)
[place] OFFICE PROGRESS NOTE  Patient Care Team: Kirk Ruths, MD as PCP - General (Internal Medicine)  Cancer Staging No matching staging information was found for the patient.   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    DIAGNOSIS: Essential thrombocytosis  STAGE:  Low risk       ;GOALS: control  CURRENT/MOST RECENT THERAPY: Anagrelide   Essential thrombocythemia ([place])      INTERVAL HISTORY:  Jordan Jefferson 53 y.o.  male pleasant patient above history of low risk essential thrombocytosis on anagrelide is here for follow-up.  Patient denies any blood clots.  Denies any weight loss.  No nausea or vomiting.  Denies any burning pain in fingertips and toes.  Review of Systems  Constitutional: Negative for chills, diaphoresis, fever, malaise/fatigue and weight loss.  HENT: Negative for nosebleeds and sore throat.   Eyes: Negative for double vision.  Respiratory: Negative for cough, hemoptysis, sputum production, shortness of breath and wheezing.   Cardiovascular: Negative for chest pain, palpitations, orthopnea and leg swelling.  Gastrointestinal: Negative for abdominal pain, blood in stool, constipation, diarrhea, heartburn, melena, nausea and vomiting.  Genitourinary: Negative for dysuria, frequency and urgency.  Musculoskeletal: Negative for back pain and joint pain.  Skin: Negative.  Negative for itching and rash.  Neurological: Negative for dizziness, tingling, focal weakness, weakness and headaches.  Endo/Heme/Allergies: Does not bruise/bleed easily.  Psychiatric/Behavioral: Negative for depression. The patient is not nervous/anxious and does not have insomnia.       PAST MEDICAL HISTORY  :  Past Medical History:  Diagnosis Date  . Essential thrombocytosis ([place])   . GERD (gastroesophageal reflux disease)   . Helicobacter pylori ab+    resolved  . IDA (iron deficiency anemia)   . Neutropenia ([place])     PAST SURGICAL HISTORY :   Past Surgical History:  Procedure Laterality Date  . COLONOSCOPY  03/2013  . COLONOSCOPY WITH PROPOFOL N/A 08/31/2018   Procedure: COLONOSCOPY WITH PROPOFOL;  Surgeon: Toledo, Benay Pike, MD;  Location: [facility] ENDOSCOPY;  Service: Gastroenterology;  Laterality: N/A;  . HIATAL HERNIA REPAIR    . UPPER GI ENDOSCOPY  11/2007    FAMILY HISTORY :   Family History  Problem Relation Age of Onset  . Kidney cancer Father 56       ? liver cancer  . Colon cancer Mother 13    SOCIAL HISTORY:   Social History   Tobacco Use  . Smoking status: Never Smoker  . Smokeless tobacco: Never Used  Substance Use Topics  . Alcohol use: No    Alcohol/week: 0.0 standard drinks    Comment: rarely  . Drug use: No    ALLERGIES:  has No Known Allergies.  MEDICATIONS:  Current Outpatient Medications  Medication Sig Dispense Refill  . anagrelide (AGRYLIN) 1 MG capsule Take 1 capsule (1 mg total) by mouth 2 (two) times daily. 180 capsule 3  . aspirin EC 81 MG tablet Take 1 tablet by mouth daily.    . BRYHALI 0.01 % LOTN     . EUCRISA 2 % OINT APPLY TO AFFECTED AREA EVERY DAY    . fexofenadine (ALLEGRA) 180 MG tablet Take 180 mg by mouth daily.     . Fluocinolone Acetonide Body 0.01 %  OIL Apply 1 application topically as needed.     Marland Kitchen omeprazole (PRILOSEC) 40 MG capsule Take 40 mg by mouth daily.  3  . pimecrolimus (ELIDEL) 1 % cream      No current facility-administered medications for this visit.     PHYSICAL EXAMINATION: ECOG PERFORMANCE STATUS: 0 - Asymptomatic  There were no vitals taken for this visit.  There were no vitals filed for this visit.  Physical Exam  Constitutional: He is oriented to person, place, and time and well-developed,  well-nourished, and in no distress.  HENT:  Head: Normocephalic and atraumatic.  Mouth/Throat: Oropharynx is clear and moist. No oropharyngeal exudate.  Eyes: Pupils are equal, round, and reactive to light.  Neck: Normal range of motion. Neck supple.  Cardiovascular: Normal rate and regular rhythm.  Pulmonary/Chest: No respiratory distress. He has no wheezes.  Abdominal: Soft. Bowel sounds are normal. He exhibits no distension and no mass. There is no abdominal tenderness. There is no rebound and no guarding.  Musculoskeletal: Normal range of motion.        General: No tenderness or edema.  Neurological: He is alert and oriented to person, place, and time.  Skin: Skin is warm.  Psychiatric: Affect normal.      LABORATORY DATA:  I have reviewed the data as listed    Component Value Date/Time   CREATININE 0.93 11/01/2014 1538   GFRNONAA >60 11/01/2014 1538   GFRAA >60 11/01/2014 1538    No results found for: SPEP, UPEP  Lab Results  Component Value Date   WBC 3.0 (L) 01/01/2017   NEUTROABS 1.3 (L) 01/01/2017   HGB 13.0 01/01/2017   HCT 39.1 (L) 01/01/2017   MCV 103.3 (H) 01/01/2017   PLT 666 (H) 01/01/2017      Chemistry      Component Value Date/Time   CREATININE 0.93 11/01/2014 1538   No results found for: CALCIUM, ALKPHOS, AST, ALT, BILITOT     RADIOGRAPHIC STUDIES: I have personally reviewed the radiological images as listed and agreed with the findings in the report. No results found.   ASSESSMENT & PLAN:  No problem-specific Assessment & Plan notes found for this encounter.   No orders of the defined types were placed in this encounter.  All questions were answered. The patient knows to call the clinic with any problems, questions or concerns.      Cammie Sickle, MD 07/20/2019 12:54 PM

## 2019-07-28 ENCOUNTER — Inpatient Hospital Stay: Payer: 59 | Attending: Internal Medicine | Admitting: Internal Medicine

## 2019-07-28 ENCOUNTER — Other Ambulatory Visit: Payer: Self-pay

## 2019-07-28 ENCOUNTER — Encounter: Payer: Self-pay | Admitting: Internal Medicine

## 2019-07-28 DIAGNOSIS — D473 Essential (hemorrhagic) thrombocythemia: Secondary | ICD-10-CM

## 2019-07-28 NOTE — Assessment & Plan Note (Signed)
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  Continue anagrelide.  New prescription given.   # DISPOSITION: # Follow-up labs in 6 months-MD /l1 week prior- [facility]/cmp/ldh-Dr.B

## 2019-07-28 NOTE — Progress Notes (Signed)
I connected with Jordan Jefferson on 07/28/2019 at 10:45 AM EDT by video enabled telemedicine visit and verified that I am speaking with the correct person using two identifiers.  I discussed the limitations, risks, security and privacy concerns of performing an evaluation and management service by telemedicine and the availability of in-person appointments. I also discussed with the patient that there may be a patient responsible charge related to this service. The patient expressed understanding and agreed to proceed.    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    DIAGNOSIS: Essential thrombocytosis  STAGE:  Low risk       ;GOALS: control  CURRENT/MOST RECENT THERAPY: Anagrelide   Essential thrombocythemia ([place])     Chief Complaint: Essential thrombocytosis   History of present illness:Jordan Jefferson 53 y.o.  male with history of essential thrombocytosis currently anagrelide is here for follow-up.  Patient denies any weight loss.  Denies any nausea vomiting or diarrhea.  Denies any swelling in the legs.  No chest pain no cough.  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  Continue anagrelide.  New prescription given.   # DISPOSITION: # Follow-up labs in 6 months-MD /l1 week prior- [facility]/cmp/ldh-Dr.B   Follow-up instructions:  I discussed the assessment and  treatment plan with the patient.  The patient was provided an opportunity to ask questions and all were answered.  The patient agreed with the plan and demonstrated understanding of instructions.  The patient was advised to call back or seek an in person evaluation if the symptoms worsen or if the condition fails to improve as anticipated.  Dr. Charlaine Dalton [place] at [facility] 08/01/2019 12:56 PM

## 2019-08-01 ENCOUNTER — Ambulatory Visit: Payer: 59 | Admitting: Internal Medicine

## 2019-12-07 ENCOUNTER — Encounter: Payer: Self-pay | Admitting: Internal Medicine

## 2020-01-09 ENCOUNTER — Encounter: Payer: Self-pay | Admitting: Internal Medicine

## 2020-01-26 ENCOUNTER — Encounter: Payer: Self-pay | Admitting: Internal Medicine

## 2020-01-26 ENCOUNTER — Inpatient Hospital Stay: Payer: 59 | Attending: Internal Medicine | Admitting: Internal Medicine

## 2020-01-26 ENCOUNTER — Other Ambulatory Visit: Payer: Self-pay

## 2020-01-26 DIAGNOSIS — K219 Gastro-esophageal reflux disease without esophagitis: Secondary | ICD-10-CM | POA: Insufficient documentation

## 2020-01-26 DIAGNOSIS — D473 Essential (hemorrhagic) thrombocythemia: Secondary | ICD-10-CM

## 2020-01-26 DIAGNOSIS — Z79899 Other long term (current) drug therapy: Secondary | ICD-10-CM | POA: Insufficient documentation

## 2020-01-26 DIAGNOSIS — Z800 Family history of malignant neoplasm of digestive organs: Secondary | ICD-10-CM | POA: Insufficient documentation

## 2020-01-26 DIAGNOSIS — Z8051 Family history of malignant neoplasm of kidney: Secondary | ICD-10-CM | POA: Diagnosis not present

## 2020-01-26 DIAGNOSIS — Z7982 Long term (current) use of aspirin: Secondary | ICD-10-CM | POA: Diagnosis not present

## 2020-01-26 NOTE — Assessment & Plan Note (Addendum)
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  New prescription given.  # # I discussed regarding Covid-19 precautions.  I reviewed the vaccine effectiveness and potential side effects in detail.  Also discussed long-term effectiveness and safety profile are unclear at this time.  I discussed December, 2020 ASCO position statement-that all patients are recommended COVID-19 vaccinations [when available]-as long as they do not have allergy to components of the vaccine.  However, I think the benefits of the vaccination outweigh the potential risks. Re: [id number] vaccination.  For more information/scheduling recommend call [place] [phone], 8:30am-4:30pm.   # DISPOSITION: work note-  # Follow-up labs in 6 months-MD l1 week prior- [facility]/cmp/ldh-Dr.B

## 2020-01-26 NOTE — Patient Instructions (Signed)
For more information/scheduling recommend call [place] [facility]- [phone], 8:30am-4:30pm.  

## 2020-01-26 NOTE — Progress Notes (Signed)
[place] OFFICE PROGRESS NOTE  Patient Care Team: Kirk Ruths, MD as PCP - General (Internal Medicine)  Cancer Staging No matching staging information was found for the patient.   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    DIAGNOSIS: Essential thrombocytosis  STAGE:  Low risk       ;GOALS: control  CURRENT/MOST RECENT THERAPY: Anagrelide   Essential thrombocythemia ([place])      INTERVAL HISTORY:  Jordan Jefferson 54 y.o.  male pleasant patient above history of low risk essential thrombocytosis on anagrelide is here for follow-up.  Patient denies any weight loss.  No nausea no vomiting.  Appetite is good.  No burning pain in fingertips and toes. Review of Systems  Constitutional: Negative for chills, diaphoresis, fever, malaise/fatigue and weight loss.  HENT: Negative for nosebleeds and sore throat.   Eyes: Negative for double vision.  Respiratory: Negative for cough, hemoptysis, sputum production, shortness of breath and wheezing.   Cardiovascular: Negative for chest pain, palpitations, orthopnea and leg swelling.  Gastrointestinal: Negative for abdominal pain, blood in stool, constipation, diarrhea, heartburn, melena, nausea and vomiting.  Genitourinary: Negative for dysuria, frequency and urgency.  Musculoskeletal: Negative for back pain and joint pain.  Skin: Negative.  Negative for itching and rash.  Neurological: Negative for dizziness, tingling, focal weakness, weakness and headaches.  Endo/Heme/Allergies: Does not bruise/bleed easily.  Psychiatric/Behavioral: Negative for depression. The patient is not nervous/anxious and does not have insomnia.       PAST MEDICAL HISTORY :  Past  Medical History:  Diagnosis Date  . Essential thrombocytosis ([place])   . GERD (gastroesophageal reflux disease)   . Helicobacter pylori ab+    resolved  . IDA (iron deficiency anemia)   . Neutropenia ([place])     PAST SURGICAL HISTORY :   Past Surgical History:  Procedure Laterality Date  . COLONOSCOPY  03/2013  . COLONOSCOPY WITH PROPOFOL N/A 08/31/2018   Procedure: COLONOSCOPY WITH PROPOFOL;  Surgeon: Toledo, Benay Pike, MD;  Location: [facility] ENDOSCOPY;  Service: Gastroenterology;  Laterality: N/A;  . HIATAL HERNIA REPAIR    . UPPER GI ENDOSCOPY  11/2007    FAMILY HISTORY :   Family History  Problem Relation Age of Onset  . Kidney cancer Father 1       ? liver cancer  . Colon cancer Mother 66    SOCIAL HISTORY:   Social History   Tobacco Use  . Smoking status: Never Smoker  . Smokeless tobacco: Never Used  Substance Use Topics  . Alcohol use: No    Alcohol/week: 0.0 standard drinks    Comment: rarely  . Drug use: No    ALLERGIES:  has No Known Allergies.  MEDICATIONS:  Current Outpatient Medications  Medication Sig Dispense Refill  . anagrelide (AGRYLIN) 1 MG capsule Take 1 capsule (1 mg total) by mouth 2 (two) times daily. 180 capsule 3  . aspirin EC 81 MG tablet Take 1 tablet by mouth daily.    . BRYHALI 0.01 % LOTN     . EUCRISA 2 % OINT APPLY TO AFFECTED AREA EVERY DAY    . fexofenadine (ALLEGRA) 180 MG tablet Take 180 mg by mouth daily.     . Fluocinolone Acetonide Body 0.01 % OIL Apply  1 application topically as needed.     Marland Kitchen omeprazole (PRILOSEC) 40 MG capsule Take 40 mg by mouth daily.  3  . pimecrolimus (ELIDEL) 1 % cream      No current facility-administered medications for this visit.    PHYSICAL EXAMINATION: ECOG PERFORMANCE STATUS: 0 - Asymptomatic  There were no vitals taken for this visit.  There were no vitals filed for this visit.  Physical Exam  Constitutional: He is oriented to person, place, and time and well-developed, well-nourished,  and in no distress.  HENT:  Head: Normocephalic and atraumatic.  Mouth/Throat: Oropharynx is clear and moist. No oropharyngeal exudate.  Eyes: Pupils are equal, round, and reactive to light.  Cardiovascular: Normal rate and regular rhythm.  Pulmonary/Chest: No respiratory distress. He has no wheezes.  Abdominal: Soft. Bowel sounds are normal. He exhibits no distension and no mass. There is no abdominal tenderness. There is no rebound and no guarding.  Musculoskeletal:        General: No tenderness or edema. Normal range of motion.     Cervical back: Normal range of motion and neck supple.  Neurological: He is alert and oriented to person, place, and time.  Skin: Skin is warm.  Psychiatric: Affect normal.      LABORATORY DATA:  I have reviewed the data as listed    Component Value Date/Time   CREATININE 0.93 11/01/2014 1538   GFRNONAA >60 11/01/2014 1538   GFRAA >60 11/01/2014 1538    No results found for: SPEP, UPEP  Lab Results  Component Value Date   WBC 3.0 (L) 01/01/2017   NEUTROABS 1.3 (L) 01/01/2017   HGB 13.0 01/01/2017   HCT 39.1 (L) 01/01/2017   MCV 103.3 (H) 01/01/2017   PLT 666 (H) 01/01/2017      Chemistry      Component Value Date/Time   CREATININE 0.93 11/01/2014 1538   No results found for: CALCIUM, ALKPHOS, AST, ALT, BILITOT     RADIOGRAPHIC STUDIES: I have personally reviewed the radiological images as listed and agreed with the findings in the report. No results found.   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  New prescription given.  # # I discussed regarding Covid-19 precautions.  I reviewed the vaccine effectiveness and potential side effects in detail.  Also discussed long-term effectiveness and safety profile are unclear at this time.   I discussed December, 2020 ASCO position statement-that all patients are recommended COVID-19 vaccinations [when available]-as long as they do not have allergy to components of the vaccine.  However, I think the benefits of the vaccination outweigh the potential risks. Re: [id number] vaccination.  For more information/scheduling recommend call [phone], 8:30am-4:30pm.   # DISPOSITION: work note-  # Follow-up labs in 6 months-MD l1 week prior- [facility]/cmp/ldh-Dr.B    No orders of the defined types were placed in this encounter.  All questions were answered. The patient knows to call the clinic with any problems, questions or concerns.      Cammie Sickle, MD 01/27/2020 8:01 AM

## 2020-03-17 ENCOUNTER — Other Ambulatory Visit: Payer: Self-pay | Admitting: Dermatology

## 2020-04-25 ENCOUNTER — Encounter: Payer: 59 | Admitting: Dermatology

## 2020-06-20 ENCOUNTER — Other Ambulatory Visit: Payer: Self-pay | Admitting: Internal Medicine

## 2020-06-27 ENCOUNTER — Ambulatory Visit (INDEPENDENT_AMBULATORY_CARE_PROVIDER_SITE_OTHER): Payer: 59 | Admitting: Dermatology

## 2020-06-27 ENCOUNTER — Other Ambulatory Visit: Payer: Self-pay

## 2020-06-27 DIAGNOSIS — L209 Atopic dermatitis, unspecified: Secondary | ICD-10-CM | POA: Diagnosis not present

## 2020-06-27 MED ORDER — TACROLIMUS 0.1 % EX OINT
TOPICAL_OINTMENT | Freq: Two times a day (BID) | CUTANEOUS | 3 refills | Status: DC
Start: 2020-06-27 — End: 2020-11-29

## 2020-06-27 MED ORDER — BRYHALI 0.01 % EX LOTN: TOPICAL_LOTION | CUTANEOUS | 4 refills | Status: DC

## 2020-06-27 NOTE — Progress Notes (Signed)
   Follow-Up Visit   Subjective  Jordan Jefferson is a 54 y.o. male who presents for the following: atopic dermatitis (patient is currently doing well using Protopic and Bryhali and needs his yearly refills). He has used [place], Elidel, and Fluocinolone in the past, but his current medications work well for him.   The following portions of the chart were reviewed this encounter and updated as appropriate:  Tobacco  Allergies  Meds  Problems  Med Hx  Surg Hx  Fam Hx     Review of Systems:  No other skin or systemic complaints except as noted in HPI or Assessment and Plan.  Objective  Well appearing patient in no apparent distress; mood and affect are within normal limits.  A focused examination was performed including the arms and legs. Relevant physical exam findings are noted in the Assessment and Plan.  Objective  arms and legs: Clear   Assessment & Plan  Atopic dermatitis - generalized but currently controlled on treatment arms and legs  Continue Protopic ointment (face, groin, axilla) and Bryhali lotion (on the body for more stubborn areas) QD-BID PRN flares.  Halobetasol Propionate (BRYHALI) 0.01 % LOTN - arms and legs  tacrolimus (PROTOPIC) 0.1 % ointment - arms and legs  Return in about 1 year (around 06/27/2021) for atopic dermatitis follow up/medication refills.  Luther Redo, CMA, am acting as scribe for Sarina Ser, MD .  Documentation: I have reviewed the above documentation for accuracy and completeness, and I agree with the above.  Sarina Ser, MD

## 2020-07-01 ENCOUNTER — Encounter: Payer: Self-pay | Admitting: Dermatology

## 2020-07-19 ENCOUNTER — Other Ambulatory Visit: Payer: 59

## 2020-07-26 ENCOUNTER — Ambulatory Visit: Payer: 59 | Admitting: Internal Medicine

## 2020-07-27 ENCOUNTER — Encounter: Payer: Self-pay | Admitting: Internal Medicine

## 2020-07-30 ENCOUNTER — Ambulatory Visit: Payer: 59 | Admitting: Internal Medicine

## 2020-07-31 ENCOUNTER — Encounter: Payer: Self-pay | Admitting: Internal Medicine

## 2020-07-31 ENCOUNTER — Inpatient Hospital Stay: Payer: 59 | Attending: Internal Medicine | Admitting: Internal Medicine

## 2020-07-31 ENCOUNTER — Other Ambulatory Visit: Payer: Self-pay

## 2020-07-31 DIAGNOSIS — D473 Essential (hemorrhagic) thrombocythemia: Secondary | ICD-10-CM | POA: Insufficient documentation

## 2020-07-31 DIAGNOSIS — Z7982 Long term (current) use of aspirin: Secondary | ICD-10-CM | POA: Insufficient documentation

## 2020-07-31 DIAGNOSIS — Z79899 Other long term (current) drug therapy: Secondary | ICD-10-CM | POA: Insufficient documentation

## 2020-07-31 DIAGNOSIS — K219 Gastro-esophageal reflux disease without esophagitis: Secondary | ICD-10-CM | POA: Insufficient documentation

## 2020-07-31 DIAGNOSIS — Z800 Family history of malignant neoplasm of digestive organs: Secondary | ICD-10-CM | POA: Diagnosis not present

## 2020-07-31 DIAGNOSIS — Z8051 Family history of malignant neoplasm of kidney: Secondary | ICD-10-CM | POA: Insufficient documentation

## 2020-07-31 NOTE — Assessment & Plan Note (Signed)
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  New prescription given.  # DISPOSITION: work note-  # Follow-up labs in 6 months-MD l1 week prior- [facility]/cmp/ldh-Dr.B

## 2020-07-31 NOTE — Progress Notes (Signed)
[place] OFFICE PROGRESS NOTE  Patient Care Team: Kirk Ruths, MD as PCP - General (Internal Medicine)  Cancer Staging No matching staging information was found for the patient.   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    DIAGNOSIS: Essential thrombocytosis  STAGE:  Low risk       ;GOALS: control  CURRENT/MOST RECENT THERAPY: Anagrelide   Essential thrombocythemia ([place])      INTERVAL HISTORY:  Jordan OEHLERT 54 y.o.  male pleasant patient above history of low risk essential thrombocytosis on anagrelide is here for follow-up.  Denies any new strokes.  Denies any burning pain in the fingertips or toes.  No nausea no vomiting.  No fever chills.  No weight loss.  Review of Systems  Constitutional: Negative for chills, diaphoresis, fever, malaise/fatigue and weight loss.  HENT: Negative for nosebleeds and sore throat.   Eyes: Negative for double vision.  Respiratory: Negative for cough, hemoptysis, sputum production, shortness of breath and wheezing.   Cardiovascular: Negative for chest pain, palpitations, orthopnea and leg swelling.  Gastrointestinal: Negative for abdominal pain, blood in stool, constipation, diarrhea, heartburn, melena, nausea and vomiting.  Genitourinary: Negative for dysuria, frequency and urgency.  Musculoskeletal: Negative for back pain and joint pain.  Skin: Negative.  Negative for itching and rash.  Neurological: Negative for dizziness, tingling, focal weakness, weakness and headaches.  Endo/Heme/Allergies: Does not bruise/bleed easily.  Psychiatric/Behavioral: Negative for depression. The patient is not nervous/anxious and does not have insomnia.       PAST MEDICAL  HISTORY :  Past Medical History:  Diagnosis Date  . Essential thrombocytosis ([place])   . GERD (gastroesophageal reflux disease)   . Helicobacter pylori ab+    resolved  . IDA (iron deficiency anemia)   . Neutropenia ([place])     PAST SURGICAL HISTORY :   Past Surgical History:  Procedure Laterality Date  . COLONOSCOPY  03/2013  . COLONOSCOPY WITH PROPOFOL N/A 08/31/2018   Procedure: COLONOSCOPY WITH PROPOFOL;  Surgeon: Toledo, Benay Pike, MD;  Location: [facility] ENDOSCOPY;  Service: Gastroenterology;  Laterality: N/A;  . HIATAL HERNIA REPAIR    . UPPER GI ENDOSCOPY  11/2007    FAMILY HISTORY :   Family History  Problem Relation Age of Onset  . Kidney cancer Father 77       ? liver cancer  . Colon cancer Mother 102    SOCIAL HISTORY:   Social History   Tobacco Use  . Smoking status: Never Smoker  . Smokeless tobacco: Never Used  Vaping Use  . Vaping Use: Never used  Substance Use Topics  . Alcohol use: No    Alcohol/week: 0.0 standard drinks    Comment: rarely  . Drug use: No    ALLERGIES:  has No Known Allergies.  MEDICATIONS:  Current Outpatient Medications  Medication Sig Dispense Refill  . anagrelide (AGRYLIN) 1 MG capsule TAKE 1 CAPSULE TWICE DAILY 180 capsule 3  . aspirin EC 81 MG tablet Take 1 tablet by mouth daily.    . BRYHALI 0.01 % LOTN     . fexofenadine (ALLEGRA) 180 MG tablet Take 180 mg by mouth daily.     . Fluocinolone Acetonide Body 0.01 % OIL Apply 1 application topically as needed.     Marland Kitchen  omeprazole (PRILOSEC) 40 MG capsule Take 40 mg by mouth daily.  3  . propranolol (INDERAL) 40 MG tablet Take 1 tablet by mouth 2 (two) times daily.    . tacrolimus (PROTOPIC) 0.1 % ointment APPLY TO AFFECTED AREA TWICE A DAY 180 g 0  . tacrolimus (PROTOPIC) 0.1 % ointment Apply topically 2 (two) times daily. 60 g 3  . EUCRISA 2 % OINT APPLY TO AFFECTED AREA EVERY DAY (Patient not taking: Reported on 06/27/2020)    . Halobetasol Propionate (BRYHALI) 0.01 % LOTN Apply to  aa's eczema QD-BID PRN. Avoid face, groin, and axilla. (Patient not taking: Reported on 07/30/2020) 60 g 4  . pantoprazole (PROTONIX) 40 MG tablet Take by mouth.    . pimecrolimus (ELIDEL) 1 % cream  (Patient not taking: Reported on 06/27/2020)     No current facility-administered medications for this visit.    PHYSICAL EXAMINATION: ECOG PERFORMANCE STATUS: 0 - Asymptomatic  BP (!) 160/92 (BP Location: Left Arm, Patient Position: Sitting, Cuff Size: Large)   Pulse 76   Temp (!) 97.2 F (36.2 C) (Tympanic)   Resp 16   Ht 5\' 6"  (1.676 m)   Wt 162 lb 6.4 oz (73.7 kg)   SpO2 100%   BMI 26.21 kg/m   Filed Weights   07/31/20 1534  Weight: 162 lb 6.4 oz (73.7 kg)    Physical Exam HENT:     Head: Normocephalic and atraumatic.     Mouth/Throat:     Pharynx: No oropharyngeal exudate.  Eyes:     Pupils: Pupils are equal, round, and reactive to light.  Cardiovascular:     Rate and Rhythm: Normal rate and regular rhythm.  Pulmonary:     Effort: No respiratory distress.     Breath sounds: No wheezing.  Abdominal:     General: Bowel sounds are normal. There is no distension.     Palpations: Abdomen is soft. There is no mass.     Tenderness: There is no abdominal tenderness. There is no guarding or rebound.  Musculoskeletal:        General: No tenderness. Normal range of motion.     Cervical back: Normal range of motion and neck supple.  Skin:    General: Skin is warm.  Neurological:     Mental Status: He is alert and oriented to person, place, and time.  Psychiatric:        Mood and Affect: Affect normal.       LABORATORY DATA:  I have reviewed the data as listed    Component Value Date/Time   CREATININE 0.93 11/01/2014 1538   GFRNONAA >60 11/01/2014 1538   GFRAA >60 11/01/2014 1538    No results found for: SPEP, UPEP  Lab Results  Component Value Date   WBC 3.0 (L) 01/01/2017   NEUTROABS 1.3 (L) 01/01/2017   HGB 13.0 01/01/2017   HCT 39.1 (L) 01/01/2017   MCV  103.3 (H) 01/01/2017   PLT 666 (H) 01/01/2017      Chemistry      Component Value Date/Time   CREATININE 0.93 11/01/2014 1538   No results found for: CALCIUM, ALKPHOS, AST, ALT, BILITOT     RADIOGRAPHIC STUDIES: I have personally reviewed the radiological images as listed and agreed with the findings in the report. No results found.   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  New prescription given.  # DISPOSITION: work note-  # Follow-up labs in 6 months-MD l1 week prior- [facility]/cmp/ldh-Dr.B    No orders of the defined types were placed in this encounter.  All questions were answered. The patient knows to call the clinic with any problems, questions or concerns.      Cammie Sickle, MD 09/02/2020 8:05 AM

## 2020-11-29 ENCOUNTER — Other Ambulatory Visit: Payer: Self-pay | Admitting: Dermatology

## 2020-11-29 DIAGNOSIS — L209 Atopic dermatitis, unspecified: Secondary | ICD-10-CM

## 2021-01-17 ENCOUNTER — Ambulatory Visit (INDEPENDENT_AMBULATORY_CARE_PROVIDER_SITE_OTHER): Payer: 59 | Admitting: Dermatology

## 2021-01-17 ENCOUNTER — Other Ambulatory Visit: Payer: Self-pay

## 2021-01-17 DIAGNOSIS — L819 Disorder of pigmentation, unspecified: Secondary | ICD-10-CM

## 2021-01-17 DIAGNOSIS — L209 Atopic dermatitis, unspecified: Secondary | ICD-10-CM

## 2021-01-21 ENCOUNTER — Encounter: Payer: Self-pay | Admitting: Dermatology

## 2021-01-21 ENCOUNTER — Other Ambulatory Visit: Payer: Self-pay

## 2021-01-21 ENCOUNTER — Ambulatory Visit (INDEPENDENT_AMBULATORY_CARE_PROVIDER_SITE_OTHER): Payer: 59 | Admitting: Dermatology

## 2021-01-21 DIAGNOSIS — L209 Atopic dermatitis, unspecified: Secondary | ICD-10-CM

## 2021-01-21 DIAGNOSIS — L819 Disorder of pigmentation, unspecified: Secondary | ICD-10-CM

## 2021-01-21 NOTE — Progress Notes (Signed)
    Follow-Up Visit   Subjective  Jordan Jefferson is a 55 y.o. male who presents for the following: atopic dermatitis (Patient is here today for his Adbry starter dose. He is currently using Tacrolimus 0.1% BID and Bryhali QW but is still not controlled).  The following portions of the chart were reviewed this encounter and updated as appropriate:   Tobacco  Allergies  Meds  Problems  Med Hx  Surg Hx  Fam Hx     Review of Systems:  No other skin or systemic complaints except as noted in HPI or Assessment and Plan.  Objective  Well appearing patient in no apparent distress; mood and affect are within normal limits.  A focused examination was performed including the abdomen . Relevant physical exam findings are noted in the Assessment and Plan.  Objective  Trunk, extremities: Excoriations of legs and back with some dyschromia of legs  Assessment & Plan  Atopic dermatitis - severe - with pruritus and dyschromia Trunk, extremities Atopic dermatitis (eczema) is a chronic, relapsing, pruritic condition that can significantly affect quality of life. It is often associated with allergic rhinitis and/or asthma and can require treatment with topical medications, phototherapy, or in severe cases a biologic medication called Dupixent in older children and adults.   Adbry 150mg /mL injected SQ x 2 into the RLQA and Adbry 150mg /mL injected SQ x 2 into the LLQA for a total of 600mg /mL.   Lot # [id number] Exp date 05/2022  Continue Bryhali QD PRN and Tacrolimus QD-BID PRN.   Other Related Medications Halobetasol Propionate (BRYHALI) 0.01 % LOTN tacrolimus (PROTOPIC) 0.1 % ointment  Return in about 2 weeks (around 02/04/2021) for nurse visit - Adbry injections; 1 month with Dr. Dara Lords, Rudell Cobb, CMA, am acting as scribe for Sarina Ser, MD .  Documentation: I have reviewed the above documentation for accuracy and completeness, and I agree with the above.  Sarina Ser, MD

## 2021-01-21 NOTE — Progress Notes (Signed)
   Follow-Up Visit   Subjective  Jordan Jefferson is a 55 y.o. male who presents for the following: Rash (Bilateral feet x ~6 weeks).  His atopic dermatitis is flared and he is itching all over on a daily and chronic basis now.  He had been doing well during the summer but the cooler weather tends to flare him and he has been miserable for months.  He would like more aggressive treatment.  The topical Protopic treatment that had been controlling him during the summer has not been doing well.  He is scratching and is getting discolored skin now.  The following portions of the chart were reviewed this encounter and updated as appropriate:   Tobacco  Allergies  Meds  Problems  Med Hx  Surg Hx  Fam Hx     Review of Systems:  No other skin or systemic complaints except as noted in HPI or Assessment and Plan.  Objective  Well appearing patient in no apparent distress; mood and affect are within normal limits.  A focused examination was performed including trunk, extremities. Relevant physical exam findings are noted in the Assessment and Plan.  Objective  Left Lower Leg - Anterior: Excoriations of legs and back with dyschromia of legs   Assessment & Plan  Atopic dermatitis, with dyschromia pruritus and flare All over entire body Discussed condition and treatment options - Dupixent vs Adbry vs Rinvoq. Recommend starting Adbry - will send prescription.  Continue Bryhali and Tacrolimus pending approval for Adbry.  Atopic dermatitis (eczema) is a chronic, relapsing, pruritic condition that can significantly affect quality of life. It is often associated with allergic rhinitis and/or asthma and can require treatment with topical medications, phototherapy, or in severe cases a biologic medication called Dupixent in older children and adults.   Other Related Medications Halobetasol Propionate (BRYHALI) 0.01 % LOTN tacrolimus (PROTOPIC) 0.1 % ointment  Return in about 1 month (around  02/14/2021) for Atopic Dermatitis.   I, Ashok Cordia, CMA, am acting as scribe for Sarina Ser, MD .  Documentation: I have reviewed the above documentation for accuracy and completeness, and I agree with the above.  Sarina Ser, MD

## 2021-01-22 ENCOUNTER — Encounter: Payer: Self-pay | Admitting: Dermatology

## 2021-01-25 ENCOUNTER — Encounter: Payer: Self-pay | Admitting: Internal Medicine

## 2021-01-25 ENCOUNTER — Other Ambulatory Visit: Payer: Self-pay | Admitting: *Deleted

## 2021-01-25 ENCOUNTER — Other Ambulatory Visit: Payer: Self-pay

## 2021-01-28 ENCOUNTER — Other Ambulatory Visit: Payer: Self-pay

## 2021-01-28 ENCOUNTER — Inpatient Hospital Stay: Payer: 59 | Admitting: Internal Medicine

## 2021-01-29 ENCOUNTER — Encounter: Payer: Self-pay | Admitting: Internal Medicine

## 2021-01-29 ENCOUNTER — Inpatient Hospital Stay: Payer: 59 | Attending: Internal Medicine | Admitting: Internal Medicine

## 2021-01-29 DIAGNOSIS — D473 Essential (hemorrhagic) thrombocythemia: Secondary | ICD-10-CM

## 2021-01-29 NOTE — Assessment & Plan Note (Addendum)
#   LOW RISK Essential thrombocytosis-CALR MUTATION POSITIVE- on anagrelide 1 milligram twice a day; LDH- 244 [wnl-224]; stable.  # AUG 2021/[facility]- ANC-2.4 [likley benign ethnic neutropenia]; /hemoglobin-13.2; Continue anagrelide.   # Elevated AST/ALT- 50s [N-40s]-monitor for now.  #Slightly elevated hemoglobin A1c 6.2-counseled the patient regarding physical activity/dietary discretion.  Defer to PCP for further recommendations/treatment plan.  *fac fee # DISPOSITION: # Follow-up labs in 6 months-MD-Virtual-l1 week prior- [facility]c/cmp/ldh-Dr.B

## 2021-01-29 NOTE — Progress Notes (Signed)
I connected with Jordan Jefferson on 01/29/21 at  2:30 PM EST by video enabled telemedicine visit and verified that I am speaking with the correct person using two identifiers.  I discussed the limitations, risks, security and privacy concerns of performing an evaluation and management service by telemedicine and the availability of in-person appointments. I also discussed with the patient that there may be a patient responsible charge related to this service. The patient expressed understanding and agreed to proceed.    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    DIAGNOSIS: Essential thrombocytosis  STAGE:  Low risk       ;GOALS: control  CURRENT/MOST RECENT THERAPY: Anagrelide   Essential thrombocythemia ([place])     Chief Complaint: Essential thrombocytosis.   History of present illness:Jordan Jefferson 55 y.o.  male with history of essential thrombocytosis is here for follow-up.  Patient states he was recently noted to have slightly elevated hemoglobin A1c of 6.2.  He is not diagnosed with diabetes yet.  He denies any blood clots but denies any burning pain in fingertips or toes.  Observation/objective: Alert & oriented x 3. In No acute distress.   Assessment and plan: Essential thrombocythemia ([place]) # LOW RISK Essential thrombocytosis-CALR MUTATION POSITIVE- on anagrelide 1 milligram twice a day; LDH- 244 [wnl-224]; stable.  # AUG 2021/[facility]- ANC-2.4 [likley benign ethnic neutropenia]; /hemoglobin-13.2; Continue anagrelide.   # Elevated AST/ALT- 50s [N-40s]-monitor for  now.  #Slightly elevated hemoglobin A1c 6.2-counseled the patient regarding physical activity/dietary discretion.  Defer to PCP for further recommendations/treatment plan.  *fac fee # DISPOSITION: # Follow-up labs in 6 months-MD-Virtual-l1 week prior- [facility]-cbc/cmp/ldh-Dr.B   Follow-up instructions:  I discussed the assessment and treatment plan with the patient.  The patient was provided an opportunity to ask questions and all were answered.  The patient agreed with the plan and demonstrated understanding of instructions.  The patient was advised to call back or seek an in person evaluation if the symptoms worsen or if the condition fails to improve as anticipated.  Dr. Charlaine Dalton [facility] at [facility] 01/29/2021 4:28 PM

## 2021-02-05 ENCOUNTER — Other Ambulatory Visit: Payer: Self-pay

## 2021-02-05 ENCOUNTER — Ambulatory Visit (INDEPENDENT_AMBULATORY_CARE_PROVIDER_SITE_OTHER): Payer: 59

## 2021-02-05 DIAGNOSIS — L209 Atopic dermatitis, unspecified: Secondary | ICD-10-CM | POA: Diagnosis not present

## 2021-02-05 MED ORDER — TRALOKINUMAB-LDRM 150 MG/ML ~~LOC~~ SOSY
300.0000 mg | PREFILLED_SYRINGE | Freq: Once | SUBCUTANEOUS | Status: AC
  Administered 2021-02-05: 300 mg via SUBCUTANEOUS

## 2021-02-05 NOTE — Progress Notes (Signed)
Patient here for two week Adbry injection.   Adbry 300mg injected into left upper arm. Patient tolerated well.   LOT: [id number] EXP: 05/2022 

## 2021-02-18 ENCOUNTER — Other Ambulatory Visit: Payer: Self-pay

## 2021-02-18 MED ORDER — ADBRY 150 MG/ML ~~LOC~~ SOSY: 300.0000 mg | PREFILLED_SYRINGE | SUBCUTANEOUS | 5 refills | Status: DC

## 2021-02-18 NOTE — Progress Notes (Signed)
Adbry sent in so Iantha Fallen can run PA and Appeal benefits.

## 2021-02-20 ENCOUNTER — Other Ambulatory Visit: Payer: Self-pay

## 2021-02-20 ENCOUNTER — Ambulatory Visit (INDEPENDENT_AMBULATORY_CARE_PROVIDER_SITE_OTHER): Payer: 59 | Admitting: Dermatology

## 2021-02-20 DIAGNOSIS — L2089 Other atopic dermatitis: Secondary | ICD-10-CM | POA: Diagnosis not present

## 2021-02-20 NOTE — Patient Instructions (Signed)

## 2021-02-20 NOTE — Progress Notes (Signed)
   Follow-Up Visit   Subjective  Jordan Jefferson is a 55 y.o. male who presents for the following: Follow-up (Atopic dermatitis follow up - 4 week follow up - Adbry injections  - he is doing well with less itching).  The following portions of the chart were reviewed this encounter and updated as appropriate:   Tobacco  Allergies  Meds  Problems  Med Hx  Surg Hx  Fam Hx     Review of Systems:  No other skin or systemic complaints except as noted in HPI or Assessment and Plan.  Objective  Well appearing patient in no apparent distress; mood and affect are within normal limits.  A focused examination was performed including trunk, legs. Relevant physical exam findings are noted in the Assessment and Plan.  Objective  Trunk, extremities: Minimal hyperpigmentation and no excoriations of back. Some dyschromia of pretibial areas but not excoriations. Peeling of hands, wrists and ankles/feet.   Assessment & Plan  Atopic dermatitis with significant pruritus - improving on systemic Adbry injections. Trunk, extremities Chronic and persistent  Atopic dermatitis - Severe, on Adbry (biologic medication).  Atopic dermatitis (eczema) is a chronic, relapsing, pruritic condition that can significantly affect quality of life. It is often associated with allergic rhinitis and/or asthma and can require treatment with topical medications, phototherapy, or in severe cases a biologic medication called Dupixent or Adbry.    He has improved with much less itching since starting Adbry.  Continue Adbry 150mg /ml 2 injections every 2 weeks up to week 16 (05/13/2021) then may decrease to once monthly if well controlled.  Return in about 3 months (around 05/23/2021) for Atopic Dermatitis.  I, Ashok Cordia, CMA, am acting as scribe for Sarina Ser, MD .  Documentation: I have reviewed the above documentation for accuracy and completeness, and I agree with the above.  Sarina Ser, MD

## 2021-02-26 ENCOUNTER — Encounter: Payer: Self-pay | Admitting: Dermatology

## 2021-03-11 ENCOUNTER — Encounter: Payer: Self-pay | Admitting: Dermatology

## 2021-03-12 MED ORDER — OPZELURA 1.5 % EX CREA: 1.0000 "application " | TOPICAL_CREAM | Freq: Every day | CUTANEOUS | 2 refills | Status: DC

## 2021-03-12 NOTE — Telephone Encounter (Signed)
Patient also left the same information on a nurse voicemail. He feels like he is getting much worse instead of better.

## 2021-03-12 NOTE — Telephone Encounter (Signed)
Patient advised of information per Dr. Nehemiah Massed and 3 week follow up made.  Opzelura sent in to [facility].

## 2021-03-19 ENCOUNTER — Other Ambulatory Visit: Payer: Self-pay | Admitting: Physical Medicine & Rehabilitation

## 2021-03-19 DIAGNOSIS — M5442 Lumbago with sciatica, left side: Secondary | ICD-10-CM

## 2021-03-19 DIAGNOSIS — M5441 Lumbago with sciatica, right side: Secondary | ICD-10-CM

## 2021-03-20 ENCOUNTER — Telehealth: Payer: Self-pay

## 2021-03-20 NOTE — Telephone Encounter (Signed)
Pt calling to question why was he prescribed Opzelura cream while taking Adbry biologic injections, pt report he read side effects on Opzelura it does not recommend using with biologics and does not recommend using over more than 20% of the body

## 2021-03-21 ENCOUNTER — Telehealth: Payer: Self-pay

## 2021-03-21 NOTE — Telephone Encounter (Signed)
Called discussed with pt he was prescribed this because he called complaining of progressive itching despite being on Adbry shots.  Opzelura is a topical treatment for Eczema and particularly should help the itch component. He may use it if he would like.  It should not cause any problem using in conjunction with Adbry.  I would recommend he keep his follow up appt. Call if further questions.

## 2021-03-21 NOTE — Telephone Encounter (Signed)
He was prescribed this because he called complaining of progressive itching despite being on Adbry shots.  Opzelura is a topical treatment for Eczema and particularly should help the itch component. He may use it if he would like.  It should not cause any problem using in conjunction with Adbry.  I would recommend he keep his follow up appt. Call if further questions.

## 2021-03-25 ENCOUNTER — Ambulatory Visit: Payer: 59

## 2021-03-26 ENCOUNTER — Other Ambulatory Visit: Payer: Self-pay

## 2021-03-26 MED ORDER — ADBRY 150 MG/ML ~~LOC~~ SOSY: 300.0000 mg | PREFILLED_SYRINGE | SUBCUTANEOUS | 2 refills | Status: DC

## 2021-03-26 NOTE — Progress Notes (Signed)
RX RF for Adbry to keep patient remaining on patient assistance program.

## 2021-04-03 ENCOUNTER — Ambulatory Visit: Payer: 59

## 2021-04-04 ENCOUNTER — Other Ambulatory Visit: Payer: Self-pay

## 2021-04-04 ENCOUNTER — Ambulatory Visit
Admission: RE | Admit: 2021-04-04 | Discharge: 2021-04-04 | Disposition: A | Discharge status: Home / Self Care | Payer: 59 | Source: Ambulatory Visit | Attending: Physical Medicine & Rehabilitation | Admitting: Physical Medicine & Rehabilitation

## 2021-04-04 ENCOUNTER — Ambulatory Visit: Payer: 59 | Admitting: Dermatology

## 2021-04-04 DIAGNOSIS — M5441 Lumbago with sciatica, right side: Secondary | ICD-10-CM

## 2021-04-04 DIAGNOSIS — M5442 Lumbago with sciatica, left side: Secondary | ICD-10-CM | POA: Diagnosis present

## 2021-04-05 ENCOUNTER — Ambulatory Visit: Payer: 59

## 2021-05-08 ENCOUNTER — Ambulatory Visit: Payer: 59 | Admitting: Dermatology

## 2021-06-18 ENCOUNTER — Other Ambulatory Visit: Payer: Self-pay

## 2021-06-18 MED ORDER — ADBRY 150 MG/ML ~~LOC~~ SOSY: 300.0000 mg | PREFILLED_SYRINGE | SUBCUTANEOUS | 0 refills | Status: DC

## 2021-06-18 NOTE — Progress Notes (Signed)
1 RF of Adbry until patient comes in for follow up next week.

## 2021-06-27 ENCOUNTER — Ambulatory Visit: Payer: 59 | Admitting: Dermatology

## 2021-06-27 ENCOUNTER — Other Ambulatory Visit: Payer: Self-pay

## 2021-06-27 ENCOUNTER — Ambulatory Visit (INDEPENDENT_AMBULATORY_CARE_PROVIDER_SITE_OTHER): Payer: 59 | Admitting: Dermatology

## 2021-06-27 DIAGNOSIS — L2089 Other atopic dermatitis: Secondary | ICD-10-CM | POA: Diagnosis not present

## 2021-06-27 DIAGNOSIS — D170 Benign lipomatous neoplasm of skin and subcutaneous tissue of head, face and neck: Secondary | ICD-10-CM | POA: Diagnosis not present

## 2021-06-27 MED ORDER — ADBRY 150 MG/ML ~~LOC~~ SOSY: 300.0000 mg | PREFILLED_SYRINGE | SUBCUTANEOUS | 5 refills | Status: DC

## 2021-06-27 NOTE — Patient Instructions (Signed)

## 2021-06-27 NOTE — Progress Notes (Signed)
   Follow-Up Visit   Subjective  Jordan Jefferson is a 55 y.o. male who presents for the following: Follow-up (Atopic dermatitis follow up - Adbry injections q2 weeks , Opzelura prn. Seems like he is doing very well. He does get flares in the antecubital areas sometimes and he uses the [place]). He complains of a growth on the neck that is growing.  The following portions of the chart were reviewed this encounter and updated as appropriate:   Tobacco  Allergies  Meds  Problems  Med Hx  Surg Hx  Fam Hx     Review of Systems:  No other skin or systemic complaints except as noted in HPI or Assessment and Plan.  Objective  Well appearing patient in no apparent distress; mood and affect are within normal limits.  A focused examination was performed including arms, legs, trunk. Relevant physical exam findings are noted in the Assessment and Plan.  Clear today  Right post neck 3.0 cm rubbery nodule   Assessment & Plan  Other atopic dermatitis Atopic dermatitis - Severe, on Adbry (biologic medication) with good results.  Improved, but not to goal.  Atopic dermatitis (eczema) is a chronic, relapsing, pruritic condition that can significantly affect quality of life. It is often associated with allergic rhinitis and/or asthma and can require treatment with topical medications, phototherapy, or in severe cases a biologic medication called Adbry.     Continue Adbry - may try to decrease to monthly injections if he is staying controlled.  Continue Opzelura cream qd prn flares  Related Medications Tralokinumab-ldrm (ADBRY) 150 MG/ML SOSY Inject 2 mLs (300 mg total) into the skin every 14 (fourteen) days. Starting on day 15 for maintenance.  Lipoma of neck Right post neck Benign. Discussed excision. Recommend evaluation by ENT. Will refer him to [facility].  Ambulatory referral to ENT - Right post neck  Return in about 6 months (around 12/28/2021).  I, Ashok Cordia, CMA, am  acting as scribe for Sarina Ser, MD .  Documentation: I have reviewed the above documentation for accuracy and completeness, and I agree with the above.  Sarina Ser, MD

## 2021-07-02 ENCOUNTER — Encounter: Payer: Self-pay | Admitting: Dermatology

## 2021-07-30 ENCOUNTER — Encounter: Payer: Self-pay | Admitting: *Deleted

## 2021-07-30 ENCOUNTER — Other Ambulatory Visit: Payer: Self-pay | Admitting: *Deleted

## 2021-07-30 MED ORDER — ANAGRELIDE HCL 1 MG PO CAPS: 1.0000 mg | ORAL_CAPSULE | Freq: Two times a day (BID) | ORAL | 3 refills | Status: DC

## 2021-07-30 NOTE — Telephone Encounter (Signed)
Follow-up and Disposition History  01/29/2021 1559 - Cammie Sickle, MD  Check-out note: # DISPOSITION:  # Follow-up labs in 6 months-MD-Virtual-l1 week prior- [facility]/cmp/ldh-Dr.B   Ok. To fill only 30 days per Dr. Rogue Bussing. Pt needs follow-up.

## 2021-07-31 ENCOUNTER — Telehealth: Payer: Self-pay | Admitting: *Deleted

## 2021-07-31 DIAGNOSIS — D473 Essential (hemorrhagic) thrombocythemia: Secondary | ICD-10-CM

## 2021-07-31 MED ORDER — ANAGRELIDE HCL 1 MG PO CAPS: 1.0000 mg | ORAL_CAPSULE | Freq: Two times a day (BID) | ORAL | 1 refills | Status: DC

## 2021-07-31 NOTE — Telephone Encounter (Signed)
Dr. B - please advise. 

## 2021-07-31 NOTE — Telephone Encounter (Signed)
Patient upet that we are only sending a 30 day supply of his medicine due to the cost he will incur. He states he will only have to pay $12 for 90 days, but a very much higher price for 30 days.  He did have a virtual visit in March and the follow up disposition orders were never taken care of and that is why he did not have a follow up appointment through no fault of his own. He is requesting a return call to discuss his medicine quantity

## 2021-07-31 NOTE — Telephone Encounter (Signed)
Spoke with patient. I explained to patient that Dr. B dispensed a 30 days supply because he did not have any follow-up schedule. I apologized to the patient that he did not get a future apt scheduled after his last mychart visit with Dr. Jacinto Reap. Refill for 90 days supply sent to pharmacy.

## 2021-08-12 ENCOUNTER — Telehealth: Payer: Self-pay | Admitting: *Deleted

## 2021-08-12 DIAGNOSIS — D473 Essential (hemorrhagic) thrombocythemia: Secondary | ICD-10-CM

## 2021-08-12 NOTE — Telephone Encounter (Signed)
Patient called stating he still has not gotten his medicine and only has 2 pills left on current prescription.  Asking if we ever sent prescription for 90 supply. I see that 180 caps was sent to [facility] on 07/31/21 I think is needed to go to Mail order

## 2021-08-13 MED ORDER — ANAGRELIDE HCL 1 MG PO CAPS: 1.0000 mg | ORAL_CAPSULE | Freq: Two times a day (BID) | ORAL | 1 refills | Status: AC

## 2021-08-13 NOTE — Telephone Encounter (Signed)
I spoke with patient who said that he uses Mail order as it is cheaper. I have resubmitted prescription to [facility]

## 2021-08-13 NOTE — Telephone Encounter (Signed)
Jordan Jefferson- please ask the patient- what mail order pharmacy that this needs to go to. This script has [facility].

## 2021-08-14 ENCOUNTER — Encounter: Payer: Self-pay | Admitting: Internal Medicine

## 2021-08-15 ENCOUNTER — Encounter: Payer: Self-pay | Admitting: Internal Medicine

## 2021-08-15 ENCOUNTER — Inpatient Hospital Stay: Payer: 59 | Attending: Internal Medicine | Admitting: Internal Medicine

## 2021-08-15 DIAGNOSIS — Z7982 Long term (current) use of aspirin: Secondary | ICD-10-CM | POA: Diagnosis not present

## 2021-08-15 DIAGNOSIS — Z79899 Other long term (current) drug therapy: Secondary | ICD-10-CM | POA: Diagnosis not present

## 2021-08-15 DIAGNOSIS — D473 Essential (hemorrhagic) thrombocythemia: Secondary | ICD-10-CM | POA: Insufficient documentation

## 2021-08-15 NOTE — Progress Notes (Signed)
[facility] OFFICE PROGRESS NOTE  Patient Care Team: Kirk Ruths, MD as PCP - General (Internal Medicine)  Cancer Staging No matching staging information was found for the patient.   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    DIAGNOSIS: Essential thrombocytosis  STAGE:  Low risk       ;GOALS: control  CURRENT/MOST RECENT THERAPY: Anagrelide   Essential thrombocythemia ([place])      INTERVAL HISTORY:  Jordan Jefferson 55 y.o.  male pleasant patient above history of low risk essential thrombocytosis on anagrelide is here for follow-up.  Denies any blood clots.  Denies any burning pain fingertips or toes.  No nausea no vomiting.  He states that he is try to eat healthy.  Review of Systems  Constitutional:  Negative for chills, diaphoresis, fever, malaise/fatigue and weight loss.  HENT:  Negative for nosebleeds and sore throat.   Eyes:  Negative for double vision.  Respiratory:  Negative for cough, hemoptysis, sputum production, shortness of breath and wheezing.   Cardiovascular:  Negative for chest pain, palpitations, orthopnea and leg swelling.  Gastrointestinal:  Negative for abdominal pain, blood in stool, constipation, diarrhea, heartburn, melena, nausea and vomiting.  Genitourinary:  Negative for dysuria, frequency and urgency.  Musculoskeletal:  Negative for back pain and joint pain.  Skin: Negative.  Negative for itching and rash.  Neurological:  Negative for dizziness, tingling, focal weakness, weakness and headaches.  Endo/Heme/Allergies:  Does not bruise/bleed easily.  Psychiatric/Behavioral:  Negative for depression. The patient is not nervous/anxious and does not have insomnia.      PAST  MEDICAL HISTORY :  Past Medical History:  Diagnosis Date   Essential thrombocytosis ([place])    GERD (gastroesophageal reflux disease)    Helicobacter pylori ab+    resolved   IDA (iron deficiency anemia)    Neutropenia ([facility])     PAST SURGICAL HISTORY :   Past Surgical History:  Procedure Laterality Date   COLONOSCOPY  03/2013   COLONOSCOPY WITH PROPOFOL N/A 08/31/2018   Procedure: COLONOSCOPY WITH PROPOFOL;  Surgeon: Toledo, Benay Pike, MD;  Location: [facility] ENDOSCOPY;  Service: Gastroenterology;  Laterality: N/A;   HIATAL HERNIA REPAIR     UPPER GI ENDOSCOPY  11/2007    FAMILY HISTORY :   Family History  Problem Relation Age of Onset   Kidney cancer Father 26       ? liver cancer   Colon cancer Mother 67    SOCIAL HISTORY:   Social History   Tobacco Use   Smoking status: Never   Smokeless tobacco: Never  Vaping Use   Vaping Use: Never used  Substance Use Topics   Alcohol use: No    Alcohol/week: 0.0 standard drinks    Comment: rarely   Drug use: No    ALLERGIES:  has No Known Allergies.  MEDICATIONS:  Current Outpatient Medications  Medication Sig Dispense Refill   anagrelide (AGRYLIN) 1 MG capsule Take 1 capsule (1 mg total) by mouth 2 (two) times daily. 180 capsule 1   aspirin EC 81 MG tablet Take 1 tablet by mouth daily.     fexofenadine (ALLEGRA) 180 MG tablet Take 180 mg by mouth daily.      pantoprazole (PROTONIX) 40 MG tablet Take by  mouth.     propranolol (INDERAL) 40 MG tablet Take 1 tablet by mouth 2 (two) times daily.     Ruxolitinib Phosphate (OPZELURA) 1.5 % CREA Apply 1 application topically daily. 60 g 2   Tralokinumab-ldrm (ADBRY) 150 MG/ML SOSY Inject 2 mLs (300 mg total) into the skin every 14 (fourteen) days. Starting on day 15 for maintenance. 4 mL 5   No current facility-administered medications for this visit.    PHYSICAL EXAMINATION: ECOG PERFORMANCE STATUS: 0 - Asymptomatic  BP 136/85 (BP Location: Left Arm, Patient Position: Sitting)    Pulse 68   Temp (!) 97.5 F (36.4 C) (Tympanic)   Resp 14   Wt 158 lb (71.7 kg)   SpO2 100%   BMI 25.50 kg/m   Filed Weights   08/15/21 0853  Weight: 158 lb (71.7 kg)    Physical Exam HENT:     Head: Normocephalic and atraumatic.     Mouth/Throat:     Pharynx: No oropharyngeal exudate.  Eyes:     Pupils: Pupils are equal, round, and reactive to light.  Cardiovascular:     Rate and Rhythm: Normal rate and regular rhythm.  Pulmonary:     Effort: No respiratory distress.     Breath sounds: No wheezing.  Abdominal:     General: Bowel sounds are normal. There is no distension.     Palpations: Abdomen is soft. There is no mass.     Tenderness: There is no abdominal tenderness. There is no guarding or rebound.  Musculoskeletal:        General: No tenderness. Normal range of motion.     Cervical back: Normal range of motion and neck supple.  Skin:    General: Skin is warm.  Neurological:     Mental Status: He is alert and oriented to person, place, and time.  Psychiatric:        Mood and Affect: Affect normal.      LABORATORY DATA:  I have reviewed the data as listed    Component Value Date/Time   CREATININE 0.93 11/01/2014 1538   GFRNONAA >60 11/01/2014 1538   GFRAA >60 11/01/2014 1538    No results found for: SPEP, UPEP  Lab Results  Component Value Date   WBC 3.0 (L) 01/01/2017   NEUTROABS 1.3 (L) 01/01/2017   HGB 13.0 01/01/2017   HCT 39.1 (L) 01/01/2017   MCV 103.3 (H) 01/01/2017   PLT 666 (H) 01/01/2017      Chemistry      Component Value Date/Time   CREATININE 0.93 11/01/2014 1538   No results found for: CALCIUM, ALKPHOS, AST, ALT, BILITOT     RADIOGRAPHIC STUDIES: I have personally reviewed the radiological images as listed and agreed with the findings in the report. No results found.   ASSESSMENT & PLAN:  Essential thrombocythemia ([place]) # LOW RISK Essential thrombocytosis- CALR MUTATION POSITIVE- on anagrelide 1 milligram twice a day;  platelets 354.  LDH- 213 [wnl-224- STABLE].    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   # Elevated AST/ALT-? Fatty liver- WNL.   #Slightly elevated hemoglobin A1c 6.2-counseled the patient regarding physical activity/dietary discretion [decrease carbs]; awaiting repeat HbA1c.   # DISPOSITION: # Follow-up labs in 6 months-MD -l1 week prior- lacorp-cbc/cmp/ldh-Dr.B   No orders of the defined types were placed in this encounter.  All questions were answered. The patient knows to call the clinic with any problems, questions or concerns.  Cammie Sickle, MD 08/15/2021 9:48 AM

## 2021-08-15 NOTE — Assessment & Plan Note (Addendum)
#   LOW RISK Essential thrombocytosis- CALR MUTATION POSITIVE- on anagrelide 1 milligram twice a day; platelets 354.  LDH- 213 [wnl-224- STABLE].    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   # Elevated AST/ALT-? Fatty liver- WNL.   #Slightly elevated hemoglobin A1c 6.2-counseled the patient regarding physical activity/dietary discretion [decrease carbs]; awaiting repeat HbA1c.   # DISPOSITION: # Follow-up labs in 6 months-MD -l1 week prior- lacorp-cbc/cmp/ldh-Dr.B

## 2021-08-15 NOTE — Progress Notes (Signed)
Pt in for follow up denies any difficulties. States meds have been sent to wrong pharmacy recently. RN verified that [facility] was pt preferred pharmacy.

## 2021-08-28 ENCOUNTER — Encounter: Payer: Self-pay | Admitting: Unknown Physician Specialty

## 2021-09-06 ENCOUNTER — Ambulatory Visit: Payer: 59 | Admitting: Anesthesiology

## 2021-09-06 ENCOUNTER — Ambulatory Visit
Admission: RE | Admit: 2021-09-06 | Discharge: 2021-09-06 | Disposition: A | Discharge status: Home / Self Care | Payer: 59 | Source: Ambulatory Visit | Attending: Unknown Physician Specialty | Admitting: Unknown Physician Specialty

## 2021-09-06 ENCOUNTER — Encounter
Admission: RE | Disposition: A | Discharge status: Home / Self Care | Payer: Self-pay | Source: Ambulatory Visit | Attending: Unknown Physician Specialty

## 2021-09-06 ENCOUNTER — Other Ambulatory Visit: Payer: Self-pay

## 2021-09-06 DIAGNOSIS — D170 Benign lipomatous neoplasm of skin and subcutaneous tissue of head, face and neck: Secondary | ICD-10-CM | POA: Diagnosis not present

## 2021-09-06 HISTORY — DX: Essential (primary) hypertension: I10

## 2021-09-06 HISTORY — PX: EXCISION MASS NECK: SHX6703

## 2021-09-06 SURGERY — EXCISION, MASS, NECK
Anesthesia: General | Site: Neck | Laterality: Right

## 2021-09-06 MED ORDER — LACTATED RINGERS IV SOLN: INTRAVENOUS | Status: DC

## 2021-09-06 MED ORDER — OXYCODONE HCL 5 MG/5ML PO SOLN: 5.0000 mg | Freq: Once | ORAL | Status: DC | PRN

## 2021-09-06 MED ORDER — 0.9 % SODIUM CHLORIDE (POUR BTL) OPTIME
TOPICAL | Status: DC | PRN
  Administered 2021-09-06: 500 mL

## 2021-09-06 MED ORDER — GLYCOPYRROLATE 0.2 MG/ML IJ SOLN
INTRAMUSCULAR | Status: DC | PRN
  Administered 2021-09-06: .1 mg via INTRAVENOUS

## 2021-09-06 MED ORDER — LIDOCAINE-EPINEPHRINE 1 %-1:100000 IJ SOLN
INTRAMUSCULAR | Status: DC | PRN
  Administered 2021-09-06: 2 mL

## 2021-09-06 MED ORDER — PROPOFOL 10 MG/ML IV BOLUS
INTRAVENOUS | Status: DC | PRN
  Administered 2021-09-06: 170 mg via INTRAVENOUS

## 2021-09-06 MED ORDER — DEXAMETHASONE SODIUM PHOSPHATE 4 MG/ML IJ SOLN
INTRAMUSCULAR | Status: DC | PRN
  Administered 2021-09-06: 10 mg via INTRAVENOUS

## 2021-09-06 MED ORDER — FENTANYL CITRATE PF 50 MCG/ML IJ SOSY: 25.0000 ug | PREFILLED_SYRINGE | INTRAMUSCULAR | Status: DC | PRN

## 2021-09-06 MED ORDER — FENTANYL CITRATE (PF) 100 MCG/2ML IJ SOLN
INTRAMUSCULAR | Status: DC | PRN
  Administered 2021-09-06: 25 ug via INTRAVENOUS

## 2021-09-06 MED ORDER — OXYCODONE HCL 5 MG PO TABS: 5.0000 mg | ORAL_TABLET | Freq: Once | ORAL | Status: DC | PRN

## 2021-09-06 MED ORDER — HYDROCODONE-ACETAMINOPHEN 5-300 MG PO TABS: 1.0000 | ORAL_TABLET | ORAL | 0 refills | Status: DC | PRN

## 2021-09-06 MED ORDER — LIDOCAINE HCL (CARDIAC) PF 100 MG/5ML IV SOSY
PREFILLED_SYRINGE | INTRAVENOUS | Status: DC | PRN
  Administered 2021-09-06: 50 mg via INTRATRACHEAL

## 2021-09-06 MED ORDER — ACETAMINOPHEN 160 MG/5ML PO SOLN: 325.0000 mg | ORAL | Status: DC | PRN

## 2021-09-06 MED ORDER — MIDAZOLAM HCL 5 MG/5ML IJ SOLN
INTRAMUSCULAR | Status: DC | PRN
  Administered 2021-09-06: 2 mg via INTRAVENOUS

## 2021-09-06 MED ORDER — ONDANSETRON HCL 4 MG/2ML IJ SOLN
INTRAMUSCULAR | Status: DC | PRN
  Administered 2021-09-06: 4 mg via INTRAVENOUS

## 2021-09-06 MED ORDER — EPHEDRINE SULFATE 50 MG/ML IJ SOLN
INTRAMUSCULAR | Status: DC | PRN
  Administered 2021-09-06 (×2): 10 mg via INTRAVENOUS
  Administered 2021-09-06 (×2): 5 mg via INTRAVENOUS

## 2021-09-06 MED ORDER — ACETAMINOPHEN 325 MG PO TABS: 325.0000 mg | ORAL_TABLET | ORAL | Status: DC | PRN

## 2021-09-06 MED ORDER — BUPIVACAINE HCL 0.5 % IJ SOLN
INTRAMUSCULAR | Status: DC | PRN
  Administered 2021-09-06: 3 mL

## 2021-09-06 SURGICAL SUPPLY — 26 items
ADH SKN CLS APL DERMABOND .7 (GAUZE/BANDAGES/DRESSINGS) ×1
CANISTER SUCT 1200ML W/VALVE (MISCELLANEOUS) ×2 IMPLANT
CORD BIP STRL DISP 12FT (MISCELLANEOUS) ×1 IMPLANT
DERMABOND ADVANCED (GAUZE/BANDAGES/DRESSINGS) ×1
DERMABOND ADVANCED .7 DNX12 (GAUZE/BANDAGES/DRESSINGS) IMPLANT
DRAPE HEAD BAR (DRAPES) ×2 IMPLANT
DRSG TEGADERM 2-3/8X2-3/4 SM (GAUZE/BANDAGES/DRESSINGS) ×1 IMPLANT
ELECT NEEDLE 20X.3 GREEN (MISCELLANEOUS) ×2
ELECTRODE NDL 20X.3 GREEN (MISCELLANEOUS) IMPLANT
ELECTRODE NEEDLE 20X.3 GREEN (MISCELLANEOUS) ×1 IMPLANT
GLOVE SURG ENC MOIS LTX SZ7.5 (GLOVE) ×4 IMPLANT
GOWN STRL REUS W/ TWL LRG LVL3 (GOWN DISPOSABLE) ×2 IMPLANT
GOWN STRL REUS W/TWL LRG LVL3 (GOWN DISPOSABLE) ×4
KIT TURNOVER KIT A (KITS) ×2 IMPLANT
NDL HYPO 25GX1X1/2 BEV (NEEDLE) ×1 IMPLANT
NEEDLE HYPO 25GX1X1/2 BEV (NEEDLE) ×4 IMPLANT
NS IRRIG 500ML POUR BTL (IV SOLUTION) ×2 IMPLANT
PACK ENT CUSTOM (PACKS) ×2 IMPLANT
PROBE MONO 100X0.75 ELECT 1.9M (MISCELLANEOUS) ×1 IMPLANT
SOL PREP PVP 2OZ (MISCELLANEOUS) ×2
SOLUTION PREP PVP 2OZ (MISCELLANEOUS) ×1 IMPLANT
SPONGE KITTNER 5P (MISCELLANEOUS) ×2 IMPLANT
SUCTION FRAZIER HANDLE 10FR (MISCELLANEOUS) ×2
SUCTION TUBE FRAZIER 10FR DISP (MISCELLANEOUS) IMPLANT
SUT VIC AB 4-0 RB1 18 (SUTURE) ×1 IMPLANT
SYR 10ML LL (SYRINGE) ×2 IMPLANT

## 2021-09-06 NOTE — Anesthesia Postprocedure Evaluation (Signed)
Anesthesia Post Note  Patient: Jordan Jefferson  Procedure(s) Performed: EXCISION RIGHT POSTERIOR NECK MASS WITH NERVE STIMULATOR (Right: Neck)     Patient location during evaluation: PACU Anesthesia Type: General Level of consciousness: awake Pain management: pain level controlled Vital Signs Assessment: post-procedure vital signs reviewed and stable Respiratory status: respiratory function stable Cardiovascular status: stable Postop Assessment: no signs of nausea or vomiting Anesthetic complications: no   No notable events documented.  Veda Canning

## 2021-09-06 NOTE — Op Note (Signed)
09/06/2021  10:20 AM    Jordan Jefferson  [id number]   Pre-Op Dx: Neck mass  Post-op Dx: SAME  Proc: Excision deep right occipital lipomatous mass approximately 2-1/2 x 2-1/2 cm  Surg:  Roena Malady  Anes:  GOT  EBL: Less than 5 cc  Comp: None  Findings: Lipomatous mass right occipital exterior neck  Procedure: Gene was identified in the holding area taken to the operating placed in left lateral decubitus position after general endotracheal anesthesia.  With the patient on the left side the mass was easily identified which had been marked in the preop area.  The mass appeared to be a lipomatous mass in the occipital region.  A natural skin crease was identified and marked a local anesthetic of 1% lidocaine with 1/100000 units epinephrine is used to inject along the incision line a total of 3 cc was used.  The neck was then prepped and draped sterilely.  Neurosignneurostimulator was brought into the field and tested to be intact.  An incision line was made overlying the mass. this was taken down to and through the subcutaneous tissues.  Hemostasis was achieved using the bipolar cautery.  The mass was easily identified.  Appeared to be a lipoma.  Blunt dissection was performed using hemostat, the neurosign stimulator was used on any tissue that appeared to be stranded consistent with a nerve.  With each tested, No stimulation was seen.  these were then divided using the bipolar.  Care was taken to stay on the capsule of the mass and it was removed in its entirety.  The dissection Was taken down to the deep fascia overlying the trapezius muscle.  With the mass removed in its entirety inspection of the wound showed no evidence of residual lipoma and no active bleeding.  Half percent plain Marcaine was then used to inject around the muscle base for pain control a total of 2 cc was used.  No active bleeding the incision was closed using subcutaneous interrupted 4-0 Vicryl and the skin was  closed using Dermabond.  The patient was then returned to anesthesia where he was awakened in the operating type cart in stable condition.   Specimen: Right occipital mass  Dispo:   Good  Plan: Discharged home follow-up 1 week  Roena Malady  09/06/2021 10:20 AM

## 2021-09-06 NOTE — H&P (Signed)
The patient's history has been reviewed, patient examined, no change in status, stable for surgery.  Questions were answered to the patients satisfaction.  

## 2021-09-06 NOTE — Anesthesia Procedure Notes (Signed)
Procedure Name: LMA Insertion Date/Time: 09/06/2021 9:34 AM Performed by: Mayme Genta, CRNA Pre-anesthesia Checklist: Patient identified, Emergency Drugs available, Suction available, Timeout performed and Patient being monitored Patient Re-evaluated:Patient Re-evaluated prior to induction Oxygen Delivery Method: Circle system utilized Preoxygenation: Pre-oxygenation with 100% oxygen Induction Type: IV induction LMA: LMA inserted LMA Size: 4.0 Number of attempts: 1 Placement Confirmation: positive ETCO2 and breath sounds checked- equal and bilateral Tube secured with: Tape

## 2021-09-06 NOTE — Anesthesia Preprocedure Evaluation (Signed)
Anesthesia Evaluation  Patient identified by MRN, date of birth, ID band Patient awake    Reviewed: Allergy & Precautions, NPO status   Airway Mallampati: II  TM Distance: >3 FB     Dental   Pulmonary neg pulmonary ROS,    Pulmonary exam normal        Cardiovascular hypertension,  Rhythm:Regular Rate:Normal     Neuro/Psych    GI/Hepatic GERD  ,  Endo/Other    Renal/GU      Musculoskeletal   Abdominal   Peds  Hematology  (+) anemia ,   Anesthesia Other Findings Essential thrombocytosis  Reproductive/Obstetrics                             Anesthesia Physical Anesthesia Plan  ASA: 2  Anesthesia Plan: General   Post-op Pain Management:    Induction: Intravenous  PONV Risk Score and Plan: Treatment may vary due to age or medical condition  Airway Management Planned: LMA  Additional Equipment:   Intra-op Plan:   Post-operative Plan:   Informed Consent: I have reviewed the patients History and Physical, chart, labs and discussed the procedure including the risks, benefits and alternatives for the proposed anesthesia with the patient or authorized representative who has indicated his/her understanding and acceptance.     Dental advisory given  Plan Discussed with: CRNA  Anesthesia Plan Comments:         Anesthesia Quick Evaluation

## 2021-09-06 NOTE — Transfer of Care (Signed)
Immediate Anesthesia Transfer of Care Note  Patient: Jordan Jefferson  Procedure(s) Performed: EXCISION RIGHT POSTERIOR NECK MASS WITH NERVE STIMULATOR (Right)  Patient Location: PACU  Anesthesia Type: General  Level of Consciousness: awake, alert  and patient cooperative  Airway and Oxygen Therapy: Patient Spontanous Breathing and Patient connected to supplemental oxygen  Post-op Assessment: Post-op Vital signs reviewed, Patient's Cardiovascular Status Stable, Respiratory Function Stable, Patent Airway and No signs of Nausea or vomiting  Post-op Vital Signs: Reviewed and stable  Complications: No notable events documented.

## 2021-09-09 ENCOUNTER — Encounter: Payer: Self-pay | Admitting: Unknown Physician Specialty

## 2021-09-09 LAB — SURGICAL PATHOLOGY

## 2021-12-03 ENCOUNTER — Other Ambulatory Visit: Payer: Self-pay

## 2021-12-03 DIAGNOSIS — L2089 Other atopic dermatitis: Secondary | ICD-10-CM

## 2021-12-03 MED ORDER — ADBRY 150 MG/ML ~~LOC~~ SOSY: 300.0000 mg | PREFILLED_SYRINGE | SUBCUTANEOUS | 1 refills | Status: DC

## 2021-12-03 NOTE — Progress Notes (Signed)
Adbry RFs to keep patient on therapy until follow up. aw

## 2022-01-29 ENCOUNTER — Other Ambulatory Visit: Payer: Self-pay

## 2022-01-29 ENCOUNTER — Ambulatory Visit: Payer: 59 | Admitting: Dermatology

## 2022-01-29 DIAGNOSIS — L209 Atopic dermatitis, unspecified: Secondary | ICD-10-CM

## 2022-01-29 DIAGNOSIS — L2089 Other atopic dermatitis: Secondary | ICD-10-CM

## 2022-01-29 DIAGNOSIS — Z79899 Other long term (current) drug therapy: Secondary | ICD-10-CM

## 2022-01-29 MED ORDER — ADBRY 150 MG/ML ~~LOC~~ SOSY: 300.0000 mg | PREFILLED_SYRINGE | SUBCUTANEOUS | 6 refills | Status: DC

## 2022-01-29 MED ORDER — OPZELURA 1.5 % EX CREA: 1.0000 "application " | TOPICAL_CREAM | Freq: Every day | CUTANEOUS | 2 refills | Status: DC

## 2022-01-29 NOTE — Progress Notes (Signed)
? ?  Follow-Up Visit ?  ?Subjective  ?Jordan Jefferson is a 56 y.o. male who presents for the following: Atopic dermatitis  (Patient currently using Adbry injections and Opzelura cream. Patient had cracking around his fingertips for about 8-12 weeks and noticed that when he decreased Adbry to QM instead of Q2W the cracking resolved. Pt c/o no s/e from [place] other than the fingertip issue. ). ? ?The following portions of the chart were reviewed this encounter and updated as appropriate:  ? Tobacco  Allergies  Meds  Problems  Med Hx  Surg Hx  Fam Hx   ?  ?Review of Systems:  No other skin or systemic complaints except as noted in HPI or Assessment and Plan. ? ?Objective  ?Well appearing patient in no apparent distress; mood and affect are within normal limits. ? ?A focused examination was performed including the face, trunk, and extremities. Relevant physical exam findings are noted in the Assessment and Plan. ? ?Trunk, extremities ?Fingertips and hands clear today. Slight roughness of the L med antecubital area with some mild dyspigmentation.  ? ? ?Assessment & Plan  ?Other atopic dermatitis ?Trunk, extremities ?With pruritis - improved with current treatment ?Chronic and persistent condition with duration or expected duration over one year. Condition is symptomatic/ bothersome to patient. Not currently at goal. ? ?Atopic dermatitis (eczema) is a chronic, relapsing, pruritic condition that can significantly affect quality of life. It is often associated with allergic rhinitis and/or asthma and can require treatment with topical medications, phototherapy, or in severe cases biologic injectable medication (Dupixent; Adbry) or Oral JAK inhibitors. ? ?Discussed with patient that cracking around the fingertips was related to eczema and cooler weather, and less likely a s/e of Adbry. ? ?Continue Adbry QM and Opzelura cream to aa's QD PRN. If eczema begins to flare OK to increase to Q2W. Continue Allegra QAM.   ? ?Tralokinumab [place]) is a treatment given by injection for adults with moderate-to-severe atopic dermatitis. Goal is control of skin condition, not cure. It is given as 4 injections at the first dose followed by 2 injection ever 2 weeks thereafter. ? ?Potential side effects include allergic reaction, injection site reactions and conjunctivitis (inflammation of the eyes).  The use of Adbry requires long term medication management, including periodic office visits. ? ?Related Medications ?Tralokinumab-ldrm (ADBRY) 150 MG/ML SOSY ?Inject 2 mLs (300 mg total) into the skin every 28 (twenty-eight) days. Starting on day 15 for maintenance. ? ?Ruxolitinib Phosphate (OPZELURA) 1.5 % CREA ?Apply 1 application topically daily. ? ?Return in about 6 months (around 08/01/2022) for A.D f/u . ? ?IRudell Cobb, CMA, am acting as scribe for Sarina Ser, MD . ?Documentation: I have reviewed the above documentation for accuracy and completeness, and I agree with the above. ? ?Sarina Ser, MD ? ?

## 2022-01-29 NOTE — Patient Instructions (Signed)

## 2022-01-29 NOTE — Progress Notes (Signed)
Updated pharmacy for Adbry injections.  ?

## 2022-01-30 ENCOUNTER — Other Ambulatory Visit: Payer: Self-pay

## 2022-01-30 MED ORDER — ADBRY 150 MG/ML ~~LOC~~ SOSY: 300.0000 mg | PREFILLED_SYRINGE | SUBCUTANEOUS | 5 refills | Status: DC

## 2022-01-30 NOTE — Progress Notes (Signed)
Adbry correction to the pharmacy. aw ?

## 2022-01-31 ENCOUNTER — Encounter: Payer: Self-pay | Admitting: Dermatology

## 2022-02-07 ENCOUNTER — Encounter: Payer: Self-pay | Admitting: Internal Medicine

## 2022-02-12 ENCOUNTER — Encounter: Payer: Self-pay | Admitting: Internal Medicine

## 2022-02-12 ENCOUNTER — Inpatient Hospital Stay: Payer: 59 | Attending: Internal Medicine | Admitting: Internal Medicine

## 2022-02-12 ENCOUNTER — Ambulatory Visit: Payer: 59 | Admitting: Internal Medicine

## 2022-02-12 ENCOUNTER — Other Ambulatory Visit: Payer: Self-pay

## 2022-02-12 DIAGNOSIS — Z79899 Other long term (current) drug therapy: Secondary | ICD-10-CM | POA: Insufficient documentation

## 2022-02-12 DIAGNOSIS — Z7982 Long term (current) use of aspirin: Secondary | ICD-10-CM | POA: Diagnosis not present

## 2022-02-12 DIAGNOSIS — D473 Essential (hemorrhagic) thrombocythemia: Secondary | ICD-10-CM | POA: Diagnosis present

## 2022-02-12 NOTE — Progress Notes (Signed)
Having trouble sleeping dur to nasal congestion. ? ? ?

## 2022-02-12 NOTE — Progress Notes (Signed)
[facility] ?OFFICE PROGRESS NOTE ? ?Patient Care Team: ?Kirk Ruths, MD as PCP - General (Internal Medicine) ? ? Cancer Staging  ?No matching staging information was found for the patient. ? ? 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  ? ? ?DIAGNOSIS: Essential thrombocytosis ? ?STAGE:  Low risk       ;GOALS: control ? ?CURRENT/MOST RECENT THERAPY: Anagrelide ?  ?Essential thrombocythemia ([place])  ? ?  ? ?INTERVAL HISTORY: Ambulating independently.  Alone. ? ?Jordan Jefferson 56 y.o.  male pleasant patient above history of low risk essential thrombocytosis on anagrelide is here for follow-up. ? ?Denies any blood clots.  Denies any burning pain fingertips or toes.  No nausea no vomiting. ? ? ?Review of Systems  ?Constitutional:  Negative for chills, diaphoresis, fever, malaise/fatigue and weight loss.  ?HENT:  Negative for nosebleeds and sore throat.   ?Eyes:  Negative for double vision.  ?Respiratory:  Negative for cough, hemoptysis, sputum production, shortness of breath and wheezing.   ?Cardiovascular:  Negative for chest pain, palpitations, orthopnea and leg swelling.  ?Gastrointestinal:  Negative for abdominal pain, blood in stool, constipation, diarrhea, heartburn, melena, nausea and vomiting.  ?Genitourinary:  Negative for dysuria, frequency and urgency.  ?Musculoskeletal:  Negative for back pain and joint pain.  ?Skin: Negative.  Negative for itching and rash.  ?Neurological:  Negative for dizziness, tingling, focal weakness, weakness and headaches.  ?Endo/Heme/Allergies:  Does not bruise/bleed easily.  ?Psychiatric/Behavioral:  Negative for depression. The patient is not nervous/anxious and does not have insomnia.   ?  ? ?PAST  MEDICAL HISTORY :  ?Past Medical History:  ?Diagnosis Date  ? Essential thrombocytosis ([place])   ? GERD (gastroesophageal reflux disease)   ? Helicobacter pylori ab+   ? resolved  ? Hypertension   ? IDA (iron deficiency anemia)   ? Neutropenia ([place])   ? ? ?PAST SURGICAL HISTORY :   ?Past Surgical History:  ?Procedure Laterality Date  ? COLONOSCOPY  03/2013  ? COLONOSCOPY WITH PROPOFOL N/A 08/31/2018  ? Procedure: COLONOSCOPY WITH PROPOFOL;  Surgeon: Toledo, Benay Pike, MD;  Location: [facility] ENDOSCOPY;  Service: Gastroenterology;  Laterality: N/A;  ? EXCISION MASS NECK Right 09/06/2021  ? Procedure: EXCISION RIGHT POSTERIOR NECK MASS WITH NERVE STIMULATOR;  Surgeon: Beverly Gust, MD;  Location: [facility];  Service: ENT;  Laterality: Right;  ? HIATAL HERNIA REPAIR    ? UPPER GI ENDOSCOPY  11/2007  ? ? ?FAMILY HISTORY :   ?Family History  ?Problem Relation Age of Onset  ? Kidney cancer Father 3  ?     ? liver cancer  ? Colon cancer Mother 40  ? ? ?SOCIAL HISTORY:   ?Social History  ? ?Tobacco Use  ? Smoking status: Never  ? Smokeless tobacco: Never  ?Vaping Use  ? Vaping Use: Never used  ?Substance Use Topics  ? Alcohol use: No  ?  Alcohol/week: 0.0 standard drinks  ?  Comment: rarely  ? Drug use: No  ? ? ?ALLERGIES:  has No Known Allergies. ? ?MEDICATIONS:  ?Current Outpatient Medications  ?Medication Sig Dispense Refill  ? fexofenadine (ALLEGRA) 180 MG tablet Take 180 mg by mouth daily.     ? propranolol (INDERAL) 40 MG tablet Take 1 tablet by  mouth 2 (two) times daily.    ? Ruxolitinib Phosphate (OPZELURA) 1.5 % CREA Apply 1 application topically daily. 60 g 2  ? Tralokinumab-ldrm (ADBRY) 150 MG/ML SOSY Inject 2 mLs (300 mg total) into the skin every 14 (fourteen) days. 4 mL 5  ? aspirin EC 81 MG tablet Take 1 tablet by mouth daily. (Patient not taking: Reported on 08/28/2021)    ? pantoprazole (PROTONIX) 40 MG tablet Take by mouth.    ? ?No current facility-administered medications for this visit.   ? ? ?PHYSICAL EXAMINATION: ?ECOG PERFORMANCE STATUS: 0 - Asymptomatic ? ?BP 130/80 (BP Location: Left Arm, Patient Position: Sitting, Cuff Size: Normal)   Pulse 67   Temp 98.4 ?F (36.9 ?C) (Tympanic)   Ht '5\' 6"'$  (1.676 m)   Wt 154 lb 12.8 oz (70.2 kg)   SpO2 100%   BMI 24.99 kg/m?  ? Jordan Jefferson Weights  ? 02/12/22 1543  ?Weight: 154 lb 12.8 oz (70.2 kg)  ? ? ?Physical Exam ?HENT:  ?   Head: Normocephalic and atraumatic.  ?   Mouth/Throat:  ?   Pharynx: No oropharyngeal exudate.  ?Eyes:  ?   Pupils: Pupils are equal, round, and reactive to light.  ?Cardiovascular:  ?   Rate and Rhythm: Normal rate and regular rhythm.  ?Pulmonary:  ?   Effort: No respiratory distress.  ?   Breath sounds: No wheezing.  ?Abdominal:  ?   General: Bowel sounds are normal. There is no distension.  ?   Palpations: Abdomen is soft. There is no mass.  ?   Tenderness: There is no abdominal tenderness. There is no guarding or rebound.  ?Musculoskeletal:     ?   General: No tenderness. Normal range of motion.  ?   Cervical back: Normal range of motion and neck supple.  ?Skin: ?   General: Skin is warm.  ?Neurological:  ?   Mental Status: He is alert and oriented to person, place, and time.  ?Psychiatric:     ?   Mood and Affect: Affect normal.  ? ? ? ? ?LABORATORY DATA:  ?I have reviewed the data as listed ?   ?Component Value Date/Time  ? CREATININE 0.93 11/01/2014 1538  ? GFRNONAA >60 11/01/2014 1538  ? GFRAA >60 11/01/2014 1538  ? ? ?No results found for: SPEP, UPEP ? ?Lab Results  ?Component Value Date  ? WBC 3.0 (L) 01/01/2017  ? NEUTROABS 1.3 (L) 01/01/2017  ? HGB 13.0 01/01/2017  ? HCT 39.1 (L) 01/01/2017  ? MCV 103.3 (H) 01/01/2017  ? PLT 666 (H) 01/01/2017  ? ? ?  Chemistry   ?   ?Component Value Date/Time  ? CREATININE 0.93 11/01/2014 1538  ? No results found for: CALCIUM, ALKPHOS, AST, ALT, BILITOT  ? ? ? ?RADIOGRAPHIC STUDIES: ?I have personally reviewed the radiological images as listed and agreed with the findings in the  report. ?No results found.  ? ?ASSESSMENT & PLAN:  ?Essential thrombocythemia ([place]) ?# LOW RISK Essential thrombocytosis- CALR MUTATION POSITIVE- on anagrelide 1 milligram twice a day; platelets 199.  LDH- 213 [wnl-224- STABLE].  Continue baby asprin ? ?# AUG 2021/[facility]- ANC-1.4  [likley benign ethnic neutropenia]; /hemoglobin-12.4; Continue anagrelide.  ? ?# DISPOSITION: ?# Follow-up labs in 6 months-MD -1 week prior- lacorp-cbc/cmp/ldh-Dr.B ? ? ?No orders of the defined types were placed in this encounter. ? ?All questions were answered. The patient knows to call the clinic with any problems, questions or concerns.  ? ?  ? Lenetta Quaker  Ann Lions, MD ?02/12/2022 4:23 PM ? ?

## 2022-02-12 NOTE — Assessment & Plan Note (Signed)
#   LOW RISK Essential thrombocytosis- CALR MUTATION POSITIVE- on anagrelide 1 milligram twice a day; platelets 199.  LDH- 213 [wnl-224- STABLE].  Continue baby asprin ? ?# AUG 2021/[facility]- ANC-1.4  [likley benign ethnic neutropenia]; /hemoglobin-12.4; Continue anagrelide.  ? ?# DISPOSITION: ?# Follow-up labs in 6 months-MD -1 week prior- lacorp-cbc/cmp/ldh-Dr.B ? ?

## 2022-02-20 ENCOUNTER — Other Ambulatory Visit: Payer: Self-pay

## 2022-02-20 MED ORDER — ADBRY 150 MG/ML ~~LOC~~ SOSY: 300.0000 mg | PREFILLED_SYRINGE | SUBCUTANEOUS | 3 refills | Status: DC

## 2022-02-20 NOTE — Addendum Note (Signed)
Addended by: Johnsie Kindred R on: 02/20/2022 10:20 AM ? ? Modules accepted: Orders ? ?

## 2022-02-20 NOTE — Progress Notes (Signed)
Update RX to [facility] for 4 syringes to be dispense at once. aw ?

## 2022-02-24 ENCOUNTER — Other Ambulatory Visit: Payer: Self-pay | Admitting: *Deleted

## 2022-02-25 MED ORDER — ANAGRELIDE HCL 1 MG PO CAPS: 1.0000 mg | ORAL_CAPSULE | Freq: Two times a day (BID) | ORAL | 1 refills | Status: DC

## 2022-06-23 ENCOUNTER — Other Ambulatory Visit: Payer: Self-pay

## 2022-06-23 MED ORDER — ADBRY 150 MG/ML ~~LOC~~ SOSY: 300.0000 mg | PREFILLED_SYRINGE | SUBCUTANEOUS | 2 refills | Status: DC

## 2022-06-23 NOTE — Progress Notes (Signed)
Refill request faxed to [place] from [place]. Escripted in to [facility].

## 2022-08-08 ENCOUNTER — Telehealth: Payer: Self-pay | Admitting: Internal Medicine

## 2022-08-08 NOTE — Telephone Encounter (Signed)
Patient left VM requesting that appointment next week be virtual.   Please advise.

## 2022-08-12 ENCOUNTER — Encounter: Payer: Self-pay | Admitting: Internal Medicine

## 2022-08-13 ENCOUNTER — Encounter: Payer: Self-pay | Admitting: Internal Medicine

## 2022-08-13 ENCOUNTER — Inpatient Hospital Stay: Payer: 59 | Attending: Internal Medicine | Admitting: Internal Medicine

## 2022-08-13 ENCOUNTER — Ambulatory Visit: Payer: 59 | Admitting: Internal Medicine

## 2022-08-13 DIAGNOSIS — D473 Essential (hemorrhagic) thrombocythemia: Secondary | ICD-10-CM

## 2022-08-13 NOTE — Assessment & Plan Note (Addendum)
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  Normal labs-follow-up in 6 months.  # DISPOSITION: # Follow-up labs in 6 months-MD -1 week prior- [facility]/cmp/ldh-Dr.B

## 2022-08-13 NOTE — Progress Notes (Signed)
I connected with Jordan Jefferson on 08/13/22 at  3:30 PM EDT by video enabled telemedicine visit and verified that I am speaking with the correct person using two identifiers.  I discussed the limitations, risks, security and privacy concerns of performing an evaluation and management service by telemedicine and the availability of in-person appointments. I also discussed with the patient that there may be a patient responsible charge related to this service. The patient expressed understanding and agreed to proceed.    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    DIAGNOSIS: Essential thrombocytosis  STAGE:  Low risk       ;GOALS: control  CURRENT/MOST RECENT THERAPY: Anagrelide   Essential thrombocythemia ([place])    Chief Complaint: Essential thrombocytosis  History of present illness:Jordan Jefferson 56 y.o.  male with history of essential thrombocytosis on anagrelide is here for follow-up.  Patient denies any blood clots.  Denies any weight loss.  Any nausea vomiting abdominal pain.   Observation/objective: Alert & oriented x 3. In No acute distress.   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  Normal labs-follow-up in 6 months.  # DISPOSITION: # Follow-up labs in 6 months-MD -1 week prior- [facility]c/cmp/ldh-Dr.B  Follow-up instructions:  I discussed the assessment  and treatment plan with the patient.  The patient was provided an opportunity to ask questions and all were answered.  The patient agreed with the plan and demonstrated understanding of instructions.  The patient was advised to call back or seek an in person evaluation if the symptoms worsen or if the condition fails to improve as anticipated.  Dr. Charlaine Dalton [place] at [facility]  08/13/2022 9:23 PM

## 2022-08-13 NOTE — Progress Notes (Signed)
No concerns. 

## 2022-08-14 ENCOUNTER — Telehealth: Payer: Self-pay

## 2022-08-14 NOTE — Telephone Encounter (Signed)
[facility] order for cbc and cmp mailed to pt.

## 2022-08-18 ENCOUNTER — Ambulatory Visit: Payer: 59 | Admitting: Dermatology

## 2022-08-21 ENCOUNTER — Ambulatory Visit: Payer: 59 | Admitting: Dermatology

## 2022-09-15 ENCOUNTER — Other Ambulatory Visit: Payer: Self-pay

## 2022-09-15 MED ORDER — ANAGRELIDE HCL 1 MG PO CAPS: 1.0000 mg | ORAL_CAPSULE | Freq: Two times a day (BID) | ORAL | 3 refills | Status: DC

## 2022-09-29 ENCOUNTER — Other Ambulatory Visit: Payer: Self-pay | Admitting: *Deleted

## 2022-09-29 MED ORDER — ANAGRELIDE HCL 1 MG PO CAPS: 1.0000 mg | ORAL_CAPSULE | Freq: Two times a day (BID) | ORAL | 3 refills | Status: DC

## 2022-09-29 NOTE — Telephone Encounter (Signed)
Patient called ing that his Anagrelide has not been received by [facility] [facility]. Per [facility] request, [facility] and [facility] prescription to [place]

## 2022-10-09 ENCOUNTER — Other Ambulatory Visit: Payer: Self-pay

## 2022-10-09 MED ORDER — ADBRY 150 MG/ML ~~LOC~~ SOSY: 300.0000 mg | PREFILLED_SYRINGE | SUBCUTANEOUS | 0 refills | Status: DC

## 2022-10-09 NOTE — Progress Notes (Signed)
Adbry Rfs to keep patient on patient asst program until follow up. aw

## 2022-11-06 ENCOUNTER — Other Ambulatory Visit: Payer: Self-pay

## 2022-11-06 MED ORDER — ADBRY 150 MG/ML ~~LOC~~ SOSY: 300.0000 mg | PREFILLED_SYRINGE | SUBCUTANEOUS | 0 refills | Status: DC

## 2022-11-06 NOTE — Progress Notes (Signed)
1 additional RF to continue until follow up.aw

## 2022-11-18 ENCOUNTER — Ambulatory Visit: Payer: 59 | Admitting: Dermatology

## 2022-12-25 ENCOUNTER — Encounter: Payer: Self-pay | Admitting: Dermatology

## 2022-12-25 ENCOUNTER — Ambulatory Visit (INDEPENDENT_AMBULATORY_CARE_PROVIDER_SITE_OTHER): Payer: 59 | Admitting: Dermatology

## 2022-12-25 VITALS — BP 117/69 | HR 64

## 2022-12-25 DIAGNOSIS — L2089 Other atopic dermatitis: Secondary | ICD-10-CM | POA: Diagnosis not present

## 2022-12-25 DIAGNOSIS — L209 Atopic dermatitis, unspecified: Secondary | ICD-10-CM

## 2022-12-25 DIAGNOSIS — L299 Pruritus, unspecified: Secondary | ICD-10-CM

## 2022-12-25 DIAGNOSIS — Z79899 Other long term (current) drug therapy: Secondary | ICD-10-CM | POA: Diagnosis not present

## 2022-12-25 MED ORDER — OPZELURA 1.5 % EX CREA: 1.0000 "application " | TOPICAL_CREAM | Freq: Every day | CUTANEOUS | 6 refills | Status: DC

## 2022-12-25 MED ORDER — ADBRY 150 MG/ML ~~LOC~~ SOSY: 300.0000 mg | PREFILLED_SYRINGE | SUBCUTANEOUS | 5 refills | Status: DC

## 2022-12-25 NOTE — Progress Notes (Signed)
   Follow-Up Visit   Subjective  Jordan Jefferson is a 57 y.o. male who presents for the following: Eczema (Patient here for 6 month atopic dermatitis. Currently on Adbry injections every 2 weeks and opzelura cream. Patient report has been having to use injections every 2 weeks due to itching. Denies any side effects. ). Patient reports being contacted by [place] assistance program. Was told he is reaching end of his assistance. Patient's insurance does not cover current treatment.   The following portions of the chart were reviewed this encounter and updated as appropriate:  Tobacco  Allergies  Meds  Problems  Med Hx  Surg Hx  Fam Hx     Review of Systems: No other skin or systemic complaints except as noted in HPI or Assessment and Plan.  Objective  Well appearing patient in no apparent distress; mood and affect are within normal limits.  A focused examination was performed including arms , hands. Relevant physical exam findings are noted in the Assessment and Plan.   Assessment & Plan  Atopic dermatitis, Severe with pruritus trunk and extremities With pruritis - improved with current treatment Chronic and persistent condition with duration or expected duration over one year. Condition is symptomatic/ bothersome to patient. Not currently at goal.  Patient denies joint aches or eye irritations.    Atopic dermatitis (eczema) is a chronic, relapsing, pruritic condition that can significantly affect quality of life. It is often associated with allergic rhinitis and/or asthma and can require treatment with topical medications, phototherapy, or in severe cases biologic injectable medication (Dupixent; Adbry) or Oral JAK inhibitors.   Discussed with patient that cracking around the fingertips was related to eczema and cooler weather, and less likely a s/e of Adbry.  Patient reports if he doesn't inject Adbry biweekly he will start itching.  Will submit rx  through [place] to appeal   patients coverage is about to expire with currently assistance program Patient's insurance will not cover medication   Continue: Adbry Q2 - 4 weeks; and  Opzelura cream to aa's QD PRN.  If eczema begins to flare OK to increase to Q2W.  Continue Allegra QAM.    Long term medication management.  Patient is using long term (months to years) prescription medication  to control their dermatologic condition.  These medications require periodic monitoring to evaluate for efficacy and side effects and may require periodic laboratory monitoring.  Tralokinumab [place]) is a treatment given by injection for adults with moderate-to-severe atopic dermatitis. Goal is control of skin condition, not cure. It is given as 4 injections at the first dose followed by 2 injection ever 2 weeks thereafter.   Potential side effects include allergic reaction, injection site reactions and conjunctivitis (inflammation of the eyes).  The use of Adbry requires long term medication management, including periodic office visits.  Tralokinumab-ldrm (ADBRY) 150 MG/ML SOSY - trunk and extremities Inject 2 mLs (300 mg total) into the skin every 14 (fourteen) days.  Tralokinumab-ldrm (ADBRY) 150 MG/ML SOSY - trunk and extremities Inject 2 mLs (300 mg total) into the skin every 14 (fourteen) days.  Other atopic dermatitis Related Medications Ruxolitinib Phosphate (OPZELURA) 1.5 % CREA Apply 1 application  topically daily.  No follow-ups on file. IRuthell Rummage, CMA, am acting as scribe for Sarina Ser, MD.' Documentation: I have reviewed the above documentation for accuracy and completeness, and I agree with the above.  Sarina Ser, MD

## 2022-12-25 NOTE — Patient Instructions (Signed)
Due to recent changes in healthcare laws, you may see results of your pathology and/or laboratory studies on MyChart before the doctors have had a chance to review them. We understand that in some cases there may be results that are confusing or concerning to you. Please understand that not all results are received at the same time and often the doctors may need to interpret multiple results in order to provide you with the best plan of care or course of treatment. Therefore, we ask that you please give us 2 business days to thoroughly review all your results before contacting the office for clarification. Should we see a critical lab result, you will be contacted sooner.   If You Need Anything After Your Visit  If you have any questions or concerns for your doctor, please call our main line at [phone] and press option 4 to reach your doctor's medical assistant. If no one answers, please leave a voicemail as directed and we will return your call as soon as possible. Messages left after 4 pm will be answered the following business day.   You may also send us a message via MyChart. We typically respond to MyChart messages within 1-2 business days.  For prescription refills, please ask your pharmacy to contact our office. Our fax number is [phone].  If you have an urgent issue when the clinic is closed that cannot wait until the next business day, you can page your doctor at the number below.    Please note that while we do our best to be available for urgent issues outside of office hours, we are not available 24/7.   If you have an urgent issue and are unable to reach us, you may choose to seek medical care at your doctor's office, retail clinic, urgent care center, or emergency room.  If you have a medical emergency, please immediately call 911 or go to the emergency department.  Pager Numbers  - Dr. Kowalski: [phone]  - Dr. Moye: [phone]  - Dr. Stewart:  [phone]  In the event of inclement weather, please call our main line at [phone] for an update on the status of any delays or closures.  Dermatology Medication Tips: Please keep the boxes that topical medications come in in order to help keep track of the instructions about where and how to use these. Pharmacies typically print the medication instructions only on the boxes and not directly on the medication tubes.   If your medication is too expensive, please contact our office at [phone] option 4 or send us a message through MyChart.   We are unable to tell what your co-pay for medications will be in advance as this is different depending on your insurance coverage. However, we may be able to find a substitute medication at lower cost or fill out paperwork to get insurance to cover a needed medication.   If a prior authorization is required to get your medication covered by your insurance company, please allow us 1-2 business days to complete this process.  Drug prices often vary depending on where the prescription is filled and some pharmacies may offer cheaper prices.  The website www.goodrx.com contains coupons for medications through different pharmacies. The prices here do not account for what the cost may be with help from insurance (it may be cheaper with your insurance), but the website can give you the price if you did not use any insurance.  - You can print the associated coupon and take it with   your prescription to the pharmacy.  - You may also stop by our office during regular business hours and pick up a GoodRx coupon card.  - If you need your prescription sent electronically to a different pharmacy, notify our office through [place] MyChart or by phone at [phone] option 4.     Si Usted Necesita Algo Despus de Su Visita  Tambin puede enviarnos un mensaje a travs de MyChart. Por lo general respondemos a los mensajes de MyChart en el transcurso de 1 a 2  das hbiles.  Para renovar recetas, por favor pida a su farmacia que se ponga en contacto con nuestra oficina. Nuestro nmero de fax es el [phone].  Si tiene un asunto urgente cuando la clnica est cerrada y que no puede esperar hasta el siguiente da hbil, puede llamar/localizar a su doctor(a) al nmero que aparece a continuacin.   Por favor, tenga en cuenta que aunque hacemos todo lo posible para estar disponibles para asuntos urgentes fuera del horario de oficina, no estamos disponibles las 24 horas del da, los 7 das de la semana.   Si tiene un problema urgente y no puede comunicarse con nosotros, puede optar por buscar atencin mdica  en el consultorio de su doctor(a), en una clnica privada, en un centro de atencin urgente o en una sala de emergencias.  Si tiene una emergencia mdica, por favor llame inmediatamente al 911 o vaya a la sala de emergencias.  Nmeros de bper  - Dr. Kowalski: [phone]  - Dra. Moye: [phone]  - Dra. Stewart: [phone]  En caso de inclemencias del tiempo, por favor llame a nuestra lnea principal al [phone] para una actualizacin sobre el estado de cualquier retraso o cierre.  Consejos para la medicacin en dermatologa: Por favor, guarde las cajas en las que vienen los medicamentos de uso tpico para ayudarle a seguir las instrucciones sobre dnde y cmo usarlos. Las farmacias generalmente imprimen las instrucciones del medicamento slo en las cajas y no directamente en los tubos del medicamento.   Si su medicamento es muy caro, por favor, pngase en contacto con nuestra oficina llamando al [phone] y presione la opcin 4 o envenos un mensaje a travs de MyChart.   No podemos decirle cul ser su copago por los medicamentos por adelantado ya que esto es diferente dependiendo de la cobertura de su seguro. Sin embargo, es posible que podamos encontrar un medicamento sustituto a menor costo o llenar un formulario para que el  seguro cubra el medicamento que se considera necesario.   Si se requiere una autorizacin previa para que su compaa de seguros cubra su medicamento, por favor permtanos de 1 a 2 das hbiles para completar este proceso.  Los precios de los medicamentos varan con frecuencia dependiendo del lugar de dnde se surte la receta y alguna farmacias pueden ofrecer precios ms baratos.  El sitio web www.goodrx.com tiene cupones para medicamentos de diferentes farmacias. Los precios aqu no tienen en cuenta lo que podra costar con la ayuda del seguro (puede ser ms barato con su seguro), pero el sitio web puede darle el precio si no utiliz ningn seguro.  - Puede imprimir el cupn correspondiente y llevarlo con su receta a la farmacia.  - Tambin puede pasar por nuestra oficina durante el horario de atencin regular y recoger una tarjeta de cupones de GoodRx.  - Si necesita que su receta se enve electrnicamente a una farmacia diferente, informe a nuestra oficina a travs de MyChart de [place]   o por telfono llamando al [phone] y presione la opcin 4.  

## 2023-01-02 ENCOUNTER — Encounter: Payer: Self-pay | Admitting: Dermatology

## 2023-02-04 ENCOUNTER — Other Ambulatory Visit: Payer: 59

## 2023-02-11 ENCOUNTER — Ambulatory Visit: Payer: 59 | Admitting: Internal Medicine

## 2023-02-26 ENCOUNTER — Encounter: Payer: Self-pay | Admitting: Internal Medicine

## 2023-03-11 ENCOUNTER — Encounter: Payer: Self-pay | Admitting: Internal Medicine

## 2023-03-11 ENCOUNTER — Inpatient Hospital Stay: Payer: 59 | Attending: Internal Medicine | Admitting: Internal Medicine

## 2023-03-11 DIAGNOSIS — D473 Essential (hemorrhagic) thrombocythemia: Secondary | ICD-10-CM

## 2023-03-11 NOTE — Progress Notes (Signed)
Patient recently went [place] seas and is on doxycycline but will complete the regimen this week.   Overall he is feeling well.

## 2023-03-11 NOTE — Progress Notes (Signed)
I connected with Vicki Mallet on 03/11/23 at  3:15 PM EDT by video enabled telemedicine visit and verified that I am speaking with the correct person using two identifiers.  I discussed the limitations, risks, security and privacy concerns of performing an evaluation and management service by telemedicine and the availability of in-person appointments. I also discussed with the patient that there may be a patient responsible charge related to this service. The patient expressed understanding and agreed to proceed.    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    DIAGNOSIS: Essential thrombocytosis  STAGE:  Low risk       ;GOALS: control  CURRENT/MOST RECENT THERAPY: Anagrelide   Essential thrombocythemia   Chief Complaint: ET   History of present illness:Jordan Jefferson 57 y.o.  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    Overall he is feeling well.  Uneventful trip.  Denies any blood clots.  Observation/objective: Alert & oriented x 3. In No acute distress.   Assessment and plan: Essential thrombocythemia ([facility]) # LOW RISK Essential thrombocytosis- CALR MUTATION POSITIVE- on anagrelide 1 milligram twice a day;  Continue baby asprin; MARCH 2024- platelets- 622 [? Missed dose after trip [place]/]; for now continue Anagrelide. #[facility]-march 2024-reviewed.  Otherwise-mild anemia hemoglobin 12.3.  Check iron studies.  Will repeat blood work in 3  months.  # Benign ethnic neutropenia]- stable  # DISPOSITION: # Follow-up labs in 3  months-MD -1 week prior- [facility]/cmp/ldh; iron studies; ferritin--Dr.B    Follow-up instructions:  I discussed the assessment and treatment plan with the patient.  The patient was provided an opportunity to ask questions and all were answered.  The patient agreed with the plan and demonstrated understanding of instructions.  The patient was advised to call back or seek an in person evaluation if the symptoms worsen or if the condition fails to improve as anticipated.    Dr. Louretta Shorten [facility] at [facility] 03/11/2023 3:22 PM

## 2023-03-11 NOTE — Assessment & Plan Note (Addendum)
#   LOW RISK Essential thrombocytosis- CALR MUTATION POSITIVE- on anagrelide 1 milligram twice a day;  Continue baby asprin; MARCH 2024- platelets- 622 [? Missed dose after trip [place]/]; for now continue Anagrelide. #[facility]-march 2024-reviewed.  Otherwise-mild anemia hemoglobin 12.3.  Check iron studies.  Will repeat blood work in 3 months.  # Benign ethnic neutropenia]- stable  # DISPOSITION: # Follow-up labs in 3  months-MD -1 week prior- [facility]/cmp/ldh; iron studies; ferritin--Dr.B

## 2023-03-31 ENCOUNTER — Telehealth: Payer: Self-pay

## 2023-03-31 NOTE — Telephone Encounter (Signed)
Kyla Balzarine called from [facility] called and left VM needing information on mutual patient:  -Continuing Opzelura? -Given Adbry samples? -Failed medium to high potency steroid? -Failed calcineurin-inhibitor?  Phone: [phone] Fax: [phone]  You need an extension when dialing this number.  Unable to return call and [facility] has not [facility].

## 2023-04-01 NOTE — Telephone Encounter (Signed)
[facility] returned call and phone number is [phone] with ext [phone].  Called number and no answer & no VM option available. aw

## 2023-05-27 ENCOUNTER — Ambulatory Visit (INDEPENDENT_AMBULATORY_CARE_PROVIDER_SITE_OTHER): Payer: 59 | Admitting: Dermatology

## 2023-05-27 VITALS — BP 109/67 | HR 75

## 2023-05-27 DIAGNOSIS — Z79899 Other long term (current) drug therapy: Secondary | ICD-10-CM | POA: Diagnosis not present

## 2023-05-27 DIAGNOSIS — L2089 Other atopic dermatitis: Secondary | ICD-10-CM

## 2023-05-27 DIAGNOSIS — L299 Pruritus, unspecified: Secondary | ICD-10-CM

## 2023-05-27 DIAGNOSIS — Z7189 Other specified counseling: Secondary | ICD-10-CM | POA: Diagnosis not present

## 2023-05-27 DIAGNOSIS — L209 Atopic dermatitis, unspecified: Secondary | ICD-10-CM

## 2023-05-27 MED ORDER — DUPIXENT 300 MG/2ML ~~LOC~~ SOAJ
300.0000 mg | SUBCUTANEOUS | 6 refills | Status: DC
Start: 2023-05-27 — End: 2023-09-15

## 2023-05-27 MED ORDER — OPZELURA 1.5 % EX CREA: 1.0000 "application " | TOPICAL_CREAM | Freq: Every day | CUTANEOUS | 6 refills | Status: DC

## 2023-05-27 MED ORDER — DUPIXENT 300 MG/2ML ~~LOC~~ SOAJ: 600.0000 mg | Freq: Once | SUBCUTANEOUS | 0 refills | Status: AC

## 2023-05-27 NOTE — Progress Notes (Signed)
Follow-Up Visit   Subjective  Jordan Jefferson is a 57 y.o. male who presents for the following: Atopic Dermatitis 6 month follow up, currently using opzelura cream and allegra. Patient was on adbry but insurance will no longer cover. Reports still has some flares at legs.  The following portions of the chart were reviewed this encounter and updated as appropriate: medications, allergies, medical history  Review of Systems:  No other skin or systemic complaints except as noted in HPI or Assessment and Plan.  Objective  Well appearing patient in no apparent distress; mood and affect are within normal limits.  Areas Examined: Trunk and extremities   Relevant physical exam findings are noted in the Assessment and Plan.   Assessment & Plan   Atopic dermatitis, unspecified type  Related Medications Tralokinumab-ldrm (ADBRY) 150 MG/ML SOSY Inject 2 mLs (300 mg total) into the skin every 14 (fourteen) days.  Tralokinumab-ldrm (ADBRY) 150 MG/ML SOSY Inject 2 mLs (300 mg total) into the skin every 14 (fourteen) days.  Dupilumab (DUPIXENT) 300 MG/2ML SOPN Inject 300 mg into the skin every 14 (fourteen) days. Starting at day 15 for maintenance.  Dupilumab (DUPIXENT) 300 MG/2ML SOPN Inject 600 mg into the skin once for 1 dose. On day 1.  Other atopic dermatitis  Related Medications Ruxolitinib Phosphate (OPZELURA) 1.5 % CREA Apply 1 application  topically daily.  Dupilumab (DUPIXENT) 300 MG/2ML SOPN Inject 300 mg into the skin every 14 (fourteen) days. Starting at day 15 for maintenance.   ATOPIC DERMATITIS With significant component of pruritus Trunk and extremities  Exam:  Macular hyperpigmentation on right thigh 5% BSA  Patient had been well-controlled on systemic Adbry injections but he has gotten worse since recently being off Adbry since his insurance has now denied Adbry.   His insurance is now denying Adbry prescription despite the patient being on this for  several years.  They are requiring him to use Dupixent or Rinvoq and failing these before considering Adbry coverage.  Chronic and persistent condition with duration or expected duration over one year. Condition is symptomatic/ bothersome to patient. Not currently at goal.   Patient denies joint aches or eye irritations.   Atopic dermatitis (eczema) is a chronic, relapsing, pruritic condition that can significantly affect quality of life. It is often associated with allergic rhinitis and/or asthma and can require treatment with topical medications, phototherapy, or in severe cases biologic injectable medication (Dupixent; Adbry) or Oral JAK inhibitors.   [place] denied appeals due to insurance Patient's insurance will not cover medication  Discussed starting Dupixent. Dupilumab (Dupixent) is a treatment given by injection for adults and children with moderate-to-severe atopic dermatitis. Goal is control of skin condition, not cure. It is given as 2 injections at the first dose followed by 1 injection ever 2 weeks thereafter.  Young children are dosed monthly.  Potential side effects include allergic reaction, herpes infections, injection site reactions and conjunctivitis (inflammation of the eyes).  The use of Dupixent requires long term medication management, including periodic office visits.  Treatment Plan: Send rx for patient he will decide when he starts  Will start Dupixent start 2 shot starter   Then 1 shot every 2 weeks  Continue opzelura  Continue allegra  Recommend gentle skin care.  Return in about 6 months (around 11/26/2023) for atopic derm follow up.  Geralynn Rile, CMA, am acting as scribe for Armida Sans, MD.  Documentation: I have reviewed the above documentation for accuracy and completeness, and I agree  with the above.  Sarina Ser, MD

## 2023-05-27 NOTE — Patient Instructions (Addendum)
Your prescription was sent to [facility] in . A representative from [facility] will contact you within 3 business hours to verify your address and insurance information to schedule a free delivery. If for any reason you do not receive a phone call from them, please reach out to them. Their phone number is [phone] and their hours are Monday-Friday 9:00 am-5:00 pm.      Due to recent changes in healthcare laws, you may see results of your pathology and/or laboratory studies on MyChart before the doctors have had a chance to review them. We understand that in some cases there may be results that are confusing or concerning to you. Please understand that not all results are received at the same time and often the doctors may need to interpret multiple results in order to provide you with the best plan of care or course of treatment. Therefore, we ask that you please give us 2 business days to thoroughly review all your results before contacting the office for clarification. Should we see a critical lab result, you will be contacted sooner.   If You Need Anything After Your Visit  If you have any questions or concerns for your doctor, please call our main line at [phone] and press option 4 to reach your doctor's medical assistant. If no one answers, please leave a voicemail as directed and we will return your call as soon as possible. Messages left after 4 pm will be answered the following business day.   You may also send us a message via MyChart. We typically respond to MyChart messages within 1-2 business days.  For prescription refills, please ask your pharmacy to contact our office. Our fax number is [phone].  If you have an urgent issue when the clinic is closed that cannot wait until the next business day, you can page your doctor at the number below.    Please note that while we do our best to be available for urgent issues outside of office hours, we are not  available 24/7.   If you have an urgent issue and are unable to reach us, you may choose to seek medical care at your doctor's office, retail clinic, urgent care center, or emergency room.  If you have a medical emergency, please immediately call 911 or go to the emergency department.  Pager Numbers  - Dr. Kowalski: [phone]  - Dr. Moye: [phone]  - Dr. Stewart: [phone]  In the event of inclement weather, please call our main line at [phone] for an update on the status of any delays or closures.  Dermatology Medication Tips: Please keep the boxes that topical medications come in in order to help keep track of the instructions about where and how to use these. Pharmacies typically print the medication instructions only on the boxes and not directly on the medication tubes.   If your medication is too expensive, please contact our office at [phone] option 4 or send us a message through MyChart.   We are unable to tell what your co-pay for medications will be in advance as this is different depending on your insurance coverage. However, we may be able to find a substitute medication at lower cost or fill out paperwork to get insurance to cover a needed medication.   If a prior authorization is required to get your medication covered by your insurance company, please allow us 1-2 business days to complete this process.  Drug prices often vary depending on where the prescription is   filled and some pharmacies may offer cheaper prices.  The website www.goodrx.com contains coupons for medications through different pharmacies. The prices here do not account for what the cost may be with help from insurance (it may be cheaper with your insurance), but the website can give you the price if you did not use any insurance.  - You can print the associated coupon and take it with your prescription to the pharmacy.  - You may also stop by our office during regular business hours  and pick up a GoodRx coupon card.  - If you need your prescription sent electronically to a different pharmacy, notify our office through [facility] MyChart or by phone at [phone] option 4.     Si Usted Necesita Algo Despus de Su Visita  Tambin puede enviarnos un mensaje a travs de MyChart. Por lo general respondemos a los mensajes de MyChart en el transcurso de 1 a 2 das hbiles.  Para renovar recetas, por favor pida a su farmacia que se ponga en contacto con nuestra oficina. Nuestro nmero de fax es el [phone].  Si tiene un asunto urgente cuando la clnica est cerrada y que no puede esperar hasta el siguiente da hbil, puede llamar/localizar a su doctor(a) al nmero que aparece a continuacin.   Por favor, tenga en cuenta que aunque hacemos todo lo posible para estar disponibles para asuntos urgentes fuera del horario de oficina, no estamos disponibles las 24 horas del da, los 7 das de la semana.   Si tiene un problema urgente y no puede comunicarse con nosotros, puede optar por buscar atencin mdica  en el consultorio de su doctor(a), en una clnica privada, en un centro de atencin urgente o en una sala de emergencias.  Si tiene una emergencia mdica, por favor llame inmediatamente al 911 o vaya a la sala de emergencias.  Nmeros de bper  - Dr. Kowalski: [phone]  - Dra. Moye: [phone]  - Dra. Stewart: [phone]  En caso de inclemencias del tiempo, por favor llame a nuestra lnea principal al [phone] para una actualizacin sobre el estado de cualquier retraso o cierre.  Consejos para la medicacin en dermatologa: Por favor, guarde las cajas en las que vienen los medicamentos de uso tpico para ayudarle a seguir las instrucciones sobre dnde y cmo usarlos. Las farmacias generalmente imprimen las instrucciones del medicamento slo en las cajas y no directamente en los tubos del medicamento.   Si su medicamento es muy caro, por favor, pngase  en contacto con nuestra oficina llamando al [phone] y presione la opcin 4 o envenos un mensaje a travs de MyChart.   No podemos decirle cul ser su copago por los medicamentos por adelantado ya que esto es diferente dependiendo de la cobertura de su seguro. Sin embargo, es posible que podamos encontrar un medicamento sustituto a menor costo o llenar un formulario para que el seguro cubra el medicamento que se considera necesario.   Si se requiere una autorizacin previa para que su compaa de seguros cubra su medicamento, por favor permtanos de 1 a 2 das hbiles para completar este proceso.  Los precios de los medicamentos varan con frecuencia dependiendo del lugar de dnde se surte la receta y alguna farmacias pueden ofrecer precios ms baratos.  El sitio web www.goodrx.com tiene cupones para medicamentos de diferentes farmacias. Los precios aqu no tienen en cuenta lo que podra costar con la ayuda del seguro (puede ser ms barato con su seguro), pero el sitio web puede darle   el precio si no utiliz ningn seguro.  - Puede imprimir el cupn correspondiente y llevarlo con su receta a la farmacia.  - Tambin puede pasar por nuestra oficina durante el horario de atencin regular y recoger una tarjeta de cupones de GoodRx.  - Si necesita que su receta se enve electrnicamente a una farmacia diferente, informe a nuestra oficina a travs de MyChart de Jordan Jefferson o por telfono llamando al [phone] y presione la opcin 4.  

## 2023-05-29 ENCOUNTER — Encounter: Payer: Self-pay | Admitting: Dermatology

## 2023-06-10 ENCOUNTER — Ambulatory Visit: Payer: 59 | Admitting: Internal Medicine

## 2023-06-19 ENCOUNTER — Inpatient Hospital Stay: Payer: 59 | Admitting: Internal Medicine

## 2023-07-15 ENCOUNTER — Inpatient Hospital Stay: Payer: 59 | Admitting: Internal Medicine

## 2023-08-17 ENCOUNTER — Ambulatory Visit: Payer: 59 | Admitting: Internal Medicine

## 2023-08-21 ENCOUNTER — Inpatient Hospital Stay: Payer: 59 | Attending: Internal Medicine | Admitting: Nurse Practitioner

## 2023-08-21 ENCOUNTER — Encounter: Payer: Self-pay | Admitting: Nurse Practitioner

## 2023-08-21 VITALS — BP 165/76 | HR 80 | Temp 97.7°F | Wt 155.0 lb

## 2023-08-21 DIAGNOSIS — Z7982 Long term (current) use of aspirin: Secondary | ICD-10-CM | POA: Diagnosis not present

## 2023-08-21 DIAGNOSIS — Z79899 Other long term (current) drug therapy: Secondary | ICD-10-CM | POA: Diagnosis not present

## 2023-08-21 DIAGNOSIS — D473 Essential (hemorrhagic) thrombocythemia: Secondary | ICD-10-CM | POA: Diagnosis present

## 2023-08-21 DIAGNOSIS — K589 Irritable bowel syndrome without diarrhea: Secondary | ICD-10-CM | POA: Insufficient documentation

## 2023-08-21 DIAGNOSIS — B9681 Helicobacter pylori [H. pylori] as the cause of diseases classified elsewhere: Secondary | ICD-10-CM | POA: Insufficient documentation

## 2023-08-21 DIAGNOSIS — D649 Anemia, unspecified: Secondary | ICD-10-CM | POA: Diagnosis not present

## 2023-08-21 NOTE — Progress Notes (Signed)
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    DIAGNOSIS: Essential thrombocytosis  STAGE:  Low risk       ;GOALS: control  CURRENT/MOST RECENT THERAPY: Anagrelide   Essential thrombocythemia (HCC)   Interval History: Jordan Jefferson 57 y.o. male with history of erythrocytosis, on anagrelide, who returns to clinic for routine follow up. He reports compliance with medication. Denies any interval blood clots. No pain or burning of fingers or toes. No nausea or vomiting. He feels well and denies complaints.   Review of Systems  Constitutional:  Negative for chills, fever, malaise/fatigue and weight loss.  HENT:  Negative for hearing loss, nosebleeds, sore throat and tinnitus.   Eyes:  Negative for blurred vision and double vision.  Respiratory:  Negative for cough, hemoptysis, shortness of breath and wheezing.   Cardiovascular:  Negative for chest pain, palpitations and leg swelling.  Gastrointestinal:  Negative for abdominal pain, blood in stool, constipation, diarrhea, melena, nausea and vomiting.  Genitourinary:  Negative for dysuria and urgency.  Musculoskeletal:  Negative for back pain, falls, joint pain and myalgias.  Skin:  Negative for itching and rash.  Neurological:  Negative for dizziness, tingling, sensory change, loss of consciousness, weakness and headaches.  Endo/Heme/Allergies:  Negative for environmental allergies. Does not bruise/bleed easily.  Psychiatric/Behavioral:  Negative for depression. The patient is not nervous/anxious and does not have insomnia.    Past Medical History:  Diagnosis Date   Essential  thrombocytosis (HCC)    GERD (gastroesophageal reflux disease)    Helicobacter pylori ab+    resolved   Hypertension    IDA (iron deficiency anemia)    Neutropenia (HCC)    Past Surgical History:  Procedure Laterality Date   COLONOSCOPY  03/2013   COLONOSCOPY WITH PROPOFOL N/A 08/31/2018   Procedure: COLONOSCOPY WITH PROPOFOL;  Surgeon: Toledo, Boykin Nearing, MD;  Location: [facility] ENDOSCOPY;  Service: Gastroenterology;  Laterality: N/A;   EXCISION MASS NECK Right 09/06/2021   Procedure: EXCISION RIGHT POSTERIOR NECK MASS WITH NERVE STIMULATOR;  Surgeon: Linus Salmons, MD;  Location: [facility];  Service: ENT;  Laterality: Right;   HIATAL HERNIA REPAIR     UPPER GI ENDOSCOPY  11/2007   Family History  Problem Relation Age of Onset   Kidney cancer Father 88       ? liver cancer   Colon cancer Mother 48   Social History   Socioeconomic History   Marital status: Married    Spouse name: Not on file   Number of children: Not on file   Years of education: Not on file   Highest education level: Not on file  Occupational History   Not on file  Tobacco Use   Smoking status: Never   Smokeless tobacco: Never  Vaping Use   Vaping status: Never Used  Substance and Sexual Activity   Alcohol use: No    Alcohol/week: 0.0 standard drinks of alcohol    Comment: rarely   Drug use: No   Sexual activity: Yes    Partners: Female  Other Topics Concern   Not on file  Social History Narrative   Not on file   Social  Determinants of Health   Financial Resource Strain: Not on file  Food Insecurity: Not on file  Transportation Needs: Not on file  Physical Activity: Not on file  Stress: Not on file  Social Connections: Not on file   No Known Allergies  Current Outpatient Medications on File Prior to Visit  Medication Sig Dispense Refill   anagrelide (AGRYLIN) 1 MG capsule Take 1 capsule (1 mg total) by mouth 2 (two) times daily. 180 capsule 3   aspirin EC 81 MG tablet Take 1  tablet by mouth daily.     Dupilumab (DUPIXENT) 300 MG/2ML SOPN Inject 300 mg into the skin every 14 (fourteen) days. Starting at day 15 for maintenance. 4 mL 6   fexofenadine (ALLEGRA) 180 MG tablet Take 180 mg by mouth daily.      losartan-hydrochlorothiazide (HYZAAR) 100-12.5 MG tablet Take 1 tablet by mouth daily.     Ruxolitinib Phosphate (OPZELURA) 1.5 % CREA Apply 1 application  topically daily. 60 g 6   Tralokinumab-ldrm (ADBRY) 150 MG/ML SOSY Inject 2 mLs (300 mg total) into the skin every 14 (fourteen) days. 4 mL 5   pantoprazole (PROTONIX) 40 MG tablet Take by mouth.     promethazine-dextromethorphan (PROMETHAZINE-DM) 6.25-15 MG/5ML syrup Take 5 mLs by mouth. (Patient not taking: Reported on 03/11/2023)     Tralokinumab-ldrm (ADBRY) 150 MG/ML SOSY Inject 2 mLs (300 mg total) into the skin every 14 (fourteen) days. (Patient not taking: Reported on 03/11/2023) 4 mL 5   traZODone (DESYREL) 50 MG tablet Take 50 mg by mouth at bedtime. (Patient not taking: Reported on 03/11/2023)     No current facility-administered medications on file prior to visit.   Physical Examination: Ecog: 0  Today's Vitals   08/21/23 1517  BP: (!) 165/76  Pulse: 80  Temp: 97.7 F (36.5 C)  TempSrc: Tympanic  SpO2: 100%  Weight: 155 lb (70.3 kg)   Body mass index is 25.02 kg/m.  Physical Exam Constitutional:      Appearance: He is not ill-appearing.  Eyes:     General: No scleral icterus. Pulmonary:     Effort: No respiratory distress.  Abdominal:     General: There is no distension.     Tenderness: There is no abdominal tenderness.  Musculoskeletal:        General: No deformity.  Skin:    General: Skin is dry.     Coloration: Skin is not pale.     Findings: No bruising, erythema or lesion.  Neurological:     Mental Status: He is alert and oriented to person, place, and time.  Psychiatric:        Mood and Affect: Mood normal.        Behavior: Behavior normal.        Assessment and  plan: No problem-specific Assessment & Plan notes found for this encounter.   # LOW RISK Essential thrombocytosis- CALR MUTATION POSITIVE- on anagrelide 1 milligram twice a day. Tolerating well. Platelets in March 2024 were 622 (missed doses). Today, plt improved to 322. Continue baby aspirin daily 81 mg. Continue anagrelide at current dosing.   # Anemia- Hmg 12.8. Normocytic. Iron studies were normal. LDH normal. Likely secondary to anagrelide. Monitor.    # Benign ethnic neutropenia]- WBC 3.4, ANC 1.3. Stable. Monitor.    DISPOSITION: 6 mo- labs (cbc, cmp, ldh, ferritin, iron studies) at [facility] & see Dr Donneta Romberg virtually for follow up- la  Provided patient with copy of lab orders for [employer] [employer]  today.   I discussed the assessment and treatment plan with the patient.  The patient was provided an opportunity to ask questions and all were answered.  The patient agreed with the plan and demonstrated understanding of instructions.  The patient was advised to call back or seek an in person evaluation if the symptoms worsen or if the condition fails to improve as anticipated.  Consuello Masse, DNP, AGNP-C, AOCNP [facility] [phone] (clinic)

## 2023-08-31 ENCOUNTER — Telehealth: Payer: Self-pay

## 2023-08-31 NOTE — Telephone Encounter (Signed)
Called pt discussed [facility] was trying yo get in touch with him, patient report he will contact them today.

## 2023-09-15 ENCOUNTER — Telehealth: Payer: Self-pay

## 2023-09-15 MED ORDER — DUPIXENT 300 MG/2ML ~~LOC~~ SOAJ: 600.0000 mg | Freq: Once | SUBCUTANEOUS | 0 refills | Status: AC

## 2023-09-15 MED ORDER — DUPIXENT 300 MG/2ML ~~LOC~~ SOAJ: 300.0000 mg | SUBCUTANEOUS | 5 refills | Status: DC

## 2023-09-15 NOTE — Telephone Encounter (Signed)
Dupixent RX to [facility]. aw

## 2023-10-12 ENCOUNTER — Telehealth: Payer: Self-pay

## 2023-10-12 NOTE — Telephone Encounter (Signed)
Patient picked up loading dose of Dupixent sample. Patient given verbal training.   LOT: [id number] EXP: 03/30/25

## 2023-10-28 ENCOUNTER — Telehealth: Payer: Self-pay

## 2023-10-28 NOTE — Telephone Encounter (Signed)
Patient called about getting the flu vaccine and a hepatitis vaccine this Friday. He would like to know if its okay to inject Dupixent next week, around 12/03?

## 2023-11-10 ENCOUNTER — Telehealth: Payer: Self-pay | Admitting: *Deleted

## 2023-11-10 MED ORDER — ANAGRELIDE HCL 1 MG PO CAPS: 1.0000 mg | ORAL_CAPSULE | Freq: Two times a day (BID) | ORAL | 3 refills | Status: DC

## 2023-11-10 NOTE — Telephone Encounter (Signed)
Prescription renewal for Anagrelide sent to [facility].

## 2023-12-16 ENCOUNTER — Ambulatory Visit: Payer: 59 | Admitting: Dermatology

## 2023-12-30 ENCOUNTER — Ambulatory Visit (INDEPENDENT_AMBULATORY_CARE_PROVIDER_SITE_OTHER): Payer: 59 | Admitting: Dermatology

## 2023-12-30 DIAGNOSIS — L2089 Other atopic dermatitis: Secondary | ICD-10-CM | POA: Diagnosis not present

## 2023-12-30 DIAGNOSIS — L639 Alopecia areata, unspecified: Secondary | ICD-10-CM | POA: Diagnosis not present

## 2023-12-30 DIAGNOSIS — Z79899 Other long term (current) drug therapy: Secondary | ICD-10-CM | POA: Diagnosis not present

## 2023-12-30 MED ORDER — CLOBETASOL PROPIONATE 0.05 % EX SOLN
CUTANEOUS | 2 refills | Status: AC
Start: 2023-12-30 — End: ?

## 2023-12-30 MED ORDER — DUPIXENT 300 MG/2ML ~~LOC~~ SOAJ: 300.0000 mg | SUBCUTANEOUS | 5 refills | Status: DC

## 2023-12-30 MED ORDER — MOMETASONE FUROATE 0.1 % EX CREA: TOPICAL_CREAM | CUTANEOUS | 1 refills | Status: AC

## 2023-12-30 MED ORDER — MINOXIDIL 2.5 MG PO TABS: 5.0000 mg | ORAL_TABLET | Freq: Every day | ORAL | 6 refills | Status: DC

## 2023-12-30 MED ORDER — OPZELURA 1.5 % EX CREA: 1.0000 "application " | TOPICAL_CREAM | Freq: Every day | CUTANEOUS | 6 refills | Status: DC

## 2023-12-30 NOTE — Progress Notes (Signed)
Follow-Up Visit   Subjective  Jordan Jefferson is a 58 y.o. male who presents for the following: Atopic Dermatitis  Some itching behind legs all the time Currently on dupixent some front and sides Currently delay on shipment of Dupixent  Also reports dealing with patchy hair loss, would like discuss  Taking nutrafol for 3 weeks for men      The following portions of the chart were reviewed this encounter and updated as appropriate: medications, allergies, medical history  Review of Systems:  No other skin or systemic complaints except as noted in HPI or Assessment and Plan.  Objective  Well appearing patient in no apparent distress; mood and affect are within normal limits.  Areas Examined: B/l legs  Relevant physical exam findings are noted in the Assessment and Plan.                 Assessment & Plan   ALOPECIA AREATA   Related Medications clobetasol (TEMOVATE) 0.05 % external solution Apply topically twice daily to affected areas of scalp. Avoid applying to face, groin, and axilla. Use as directed. minoxidil (LONITEN) 2.5 MG tablet Take 2 tablets (5 mg total) by mouth daily. mometasone (ELOCON) 0.1 % cream Apply topically to affected areas of face twice 5 days weekly m - f. OTHER ATOPIC DERMATITIS   Related Medications Ruxolitinib Phosphate (OPZELURA) 1.5 % CREA Apply 1 application  topically daily. Dupilumab (DUPIXENT) 300 MG/2ML SOAJ Inject 300 mg into the skin every 14 (fourteen) days. Starting at day 15 for maintenance.   ATOPIC DERMATITIS Exam: skin clear, but continued itching on thighs 3% BSA  Chronic and persistent condition with duration or expected duration over one year. Condition is improving with treatment but not currently at goal.   Atopic dermatitis (eczema) is a chronic, relapsing, pruritic condition that can significantly affect quality of life. It is often associated with allergic rhinitis and/or asthma and can require  treatment with topical medications, phototherapy, or in severe cases biologic injectable medication (Dupixent; Adbry) or Oral JAK inhibitors.  Treatment Plan: Continue Opzelura cream bid to itchy areas Continue Allegra taking by mouth daily. Could take twice daily if itchy or could start Zyrtec or Xyzal by mouth at bedtime Continue Dupixent injections q2 weeks  Recommend gentle skin care.   Alopecia areata  Exam: Round, patchy areas of nonscarring hair loss BL parietal scalp.  Also L upper lip   Chronic and persistent condition with duration or expected duration over one year. Condition is bothersome/symptomatic for patient. Currently flared.   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  Male pattern alopecia is mediated by dihydrotestosterone which induces miniaturization of androgen-sensitive hair follicles.  It is chronic and persistent, but treatable; not curable. Topical treatment includes: - 5% topical Minoxidil Oral treatment includes: - Finasteride 1 mg qd - Minoxidil 1.25 - 5 mg qd - Dutasteride 0.5 mg qd Adjunct therapy includes: - Low Level Laser Light Therapy (LLLT) - Platelet-rich Plasma injections (PRP) - Hair Transplantation or scalp reduction  Treatment Plan: Discussed ILK injections- risk could lighten skin start clobetasol solution apply twice daily to affected areas scalp Start oral minoxidil 2.5 mg, take 1 tab PO every day x 1 month, then if well-tolerated, may increase to 2 tabs May consider jak inhibitor if not helpful, may help atopic dermatitis/itching as well. Start mometasone cream - apply topically twice daily to affected areas of face Monday - Friday only.  Topical steroids (such as triamcinolone, fluocinolone, fluocinonide, mometasone, clobetasol, halobetasol, betamethasone, hydrocortisone) can cause thinning and lightening of the skin if they are used for too long in the same area.  Your physician has selected the right strength medicine for your problem and area affected on the body. Please use your medication only as directed by your physician to prevent side effects.    Doses of minoxidil for hair loss are considered 'low dose'. This is because the doses used for hair loss are much lower than the doses which are used for conditions such as high blood pressure (hypertension). The doses used for hypertension are 10-40mg  per day.  Side effects are uncommon at the low doses (up to 2.5 mg/day) used to treat hair loss. Potential side effects, more commonly seen at higher doses, include: Increase in hair growth (hypertrichosis) elsewhere on face and body Temporary hair shedding upon starting medication which may last up to 4 weeks Ankle swelling, fluid retention, rapid weight gain more than 5 pounds Low blood pressure and feeling lightheaded or dizzy when standing up quickly Fast or irregular heartbeat Headaches    Return in about 6 months (around 06/28/2024) for atopic derm / alopecia areata with Dr. Verdell Face, Asher Muir, CMA, am acting as scribe for Willeen Niece, MD.   Documentation: I have reviewed the above documentation for accuracy and completeness, and I agree with the above.  Willeen Niece, MD

## 2023-12-30 NOTE — Patient Instructions (Addendum)
For atopic dermatitis or eczema  Continue opzelura cream to itchy areas daily  Continue Dupixent every 2 weeks Continue allegra taking by mouth daily. Could take up to 2 tabs by mouth daily if itchy      Gentle Skin Care Guide  1. Bathe no more than once a day.  2. Avoid bathing in hot water  3. Use a mild soap like Dove, Vanicream, Cetaphil, CeraVe. Can use Lever 2000 or Cetaphil antibacterial soap  4. Use soap only where you need it. On most days, use it under your arms, between your legs, and on your feet. Let the water rinse other areas unless visibly dirty.  5. When you get out of the bath/shower, use a towel to gently blot your skin dry, don't rub it.  6. While your skin is still a little damp, apply a moisturizing cream such as Vanicream, CeraVe, Cetaphil, Eucerin, Sarna lotion or plain Vaseline Jelly. For hands apply Neutrogena [place] Hand Cream or Excipial Hand Cream.  7. Reapply moisturizer any time you start to itch or feel dry.  8. Sometimes using free and clear laundry detergents can be helpful. Fabric softener sheets should be avoided. Downy Free & Gentle liquid, or any liquid fabric softener that is free of dyes and perfumes, it acceptable to use  9. If your doctor has given you prescription creams you may apply moisturizers over them   Hair Loss (Alopecia Areata) in Adults: What to Know Alopecia areata is a condition that causes hair loss. It may cause you to lose hair on your scalp in patches. Sometimes, you may lose all the hair on your scalp or all the hair on your face and body. Alopecia areata can be hard on your emotions, but it isn't dangerous. Alopecia areata is an autoimmune disease. This means that your body's defense system (immune system): Mistakes normal parts of your body for things like germs that can make you sick. Attacks your hair follicles. These are openings in your skin where hair grows. What are the causes? The cause of alopecia areata isn't  known. What increases the risk? You're more likely to get alopecia areata if you have: A family history of alopecia. A family or personal history of another autoimmune disease, such as: Type 1 diabetes. Thyroid autoimmune disease. Eczema, asthma, and allergies. Down syndrome. What are the signs or symptoms? The main symptom of alopecia areata is round spots of patchy hair loss on your scalp. The spots may itch a little. Other symptoms include: Exclamation point hairs. These are short, dark hairs in your bald patches that are wider at the top. Dents, white spots, or lines in your fingernails or toenails. Balding and loss of body hair. These are rare. Alopecia areata often starts in childhood, but it can start at any age. For some people, hair grows back on its own and hair loss doesn't happen again. For others, hair may fall out and grow back in cycles. The hair loss may last many years. How is this diagnosed? Alopecia areata is diagnosed based on your symptoms and family history. Your health care provider will also check the skin on your scalp, teeth, and nails. They may refer you to an expert in hair and skin disorders (dermatologist). You may also have tests, such as: A hair pull test. Blood tests or other screening tests. These check for autoimmune diseases, such as thyroid disease or diabetes. Skin biopsy. This is when a small piece of skin is removed for testing. Dermoscopy. This  is a procedure to look at your skin with a lighted magnifying tool. How is this treated? There's no cure for alopecia areata. Treatment is done to help get hair to grow again and keep the immune system from reacting too much. No one treatment is right for all people with alopecia areata. It depends on the type of hair loss you have and how severe it is. Work with your provider to find the best treatment for you. Treatment may include: Regular checkups to make sure your hair loss isn't getting worse. This is  called watchful waiting. Using steroid creams or pills for 6-8 weeks. These help stop the immune reaction and help hair grow back faster. Topicalmedicines, which are used on your skin. These help change your immune system response and support your hair growth cycle. Steroid shots. Immunomodulators. These medicines change how your immune system reacts. These may be used in some severe cases. Therapy and counseling with a support group or therapist, if you're having trouble coping with hair loss. Follow these instructions at home: Medicines Use topical creams only as told by your provider. Take your medicines only as told. General instructions Learn as much as you can about your alopecia areata. Try getting a wig or products to make hair look fuller or to cover bald spots. These may help you feel more comfortable with how you look. Get therapy or counseling if you're struggling with hair loss. Ask your provider to recommend a counselor or support group. Keep all follow-up visits. You'll need regular checkups to make sure your hair loss isn't getting worse. Where to find more information National Alopecia Areata Foundation: naaf.org Contact a health care provider if: Your hair loss gets worse, even with treatment. You have new symptoms. You're struggling with your feelings about hair loss. Get help right away if: Your hair loss gets worse all of a sudden. This information is not intended to replace advice given to you by your health care provider. Make sure you discuss any questions you have with your health care provider. Document Revised: 04/28/2023 Document Reviewed: 04/28/2023 Elsevier Patient Education  2024 Elsevier Inc.   Start minoxidil 2.5 mg tab - take 1 tab by mouth daily for 1 month if no side effects can increase to 2 tablets by mouth daily.   Start mometasone cream - apply topically to affected areas of face twice daily Monday through Friday only.   Start clobetasol  solution  apply twice daily to affected areas of scalp. Avoid applying to face, groin, and axilla. Use as directed. Long-term use can cause thinning of the skin.  Topical steroids (such as triamcinolone, fluocinolone, fluocinonide, mometasone, clobetasol, halobetasol, betamethasone, hydrocortisone) can cause thinning and lightening of the skin if they are used for too long in the same area. Your physician has selected the right strength medicine for your problem and area affected on the body. Please use your medication only as directed by your physician to prevent side effects.    Doses of minoxidil for hair loss are considered 'low dose'. This is because the doses used for hair loss are much lower than the doses which are used for conditions such as high blood pressure (hypertension). The doses used for hypertension are 10-40mg  per day.  Side effects are uncommon at the low doses (up to 2.5 mg/day) used to treat hair loss. Potential side effects, more commonly seen at higher doses, include: Increase in hair growth (hypertrichosis) elsewhere on face and body Temporary hair shedding upon starting medication  which may last up to 4 weeks Ankle swelling, fluid retention, rapid weight gain more than 5 pounds Low blood pressure and feeling lightheaded or dizzy when standing up quickly Fast or irregular heartbeat Headaches    Due to recent changes in healthcare laws, you may see results of your pathology and/or laboratory studies on MyChart before the doctors have had a chance to review them. We understand that in some cases there may be results that are confusing or concerning to you. Please understand that not all results are received at the same time and often the doctors may need to interpret multiple results in order to provide you with the best plan of care or course of treatment. Therefore, we ask that you please give [place] 2 business days to thoroughly review all your results before contacting the  office for clarification. Should we see a critical lab result, you will be contacted sooner.   If You Need Anything After Your Visit  If you have any questions or concerns for your doctor, please call our main line at [phone] and press option 4 to reach your doctor's medical assistant. If no one answers, please leave a voicemail as directed and we will return your call as soon as possible. Messages left after 4 pm will be answered the following business day.   You may also send [place] a message via MyChart. We typically respond to MyChart messages within 1-2 business days.  For prescription refills, please ask your pharmacy to contact our office. Our fax number is [phone].  If you have an urgent issue when the clinic is closed that cannot wait until the next business day, you can page your doctor at the number below.    Please note that while we do our best to be available for urgent issues outside of office hours, we are not available 24/7.   If you have an urgent issue and are unable to reach [place], you may choose to seek medical care at your doctor's office, retail clinic, urgent care center, or emergency room.  If you have a medical emergency, please immediately call 911 or go to the emergency department.  Pager Numbers  - Dr. Gwen Pounds: [phone]  - Dr. Roseanne Reno: [phone]  - Dr. Katrinka Blazing: [phone]   In the event of inclement weather, please call our main line at [phone] for an update on the status of any delays or closures.  Dermatology Medication Tips: Please keep the boxes that topical medications come in in order to help keep track of the instructions about where and how to use these. Pharmacies typically print the medication instructions only on the boxes and not directly on the medication tubes.   If your medication is too expensive, please contact our office at [phone] option 4 or send [place] a message through MyChart.   We are unable to tell what your  co-pay for medications will be in advance as this is different depending on your insurance coverage. However, we may be able to find a substitute medication at lower cost or fill out paperwork to get insurance to cover a needed medication.   If a prior authorization is required to get your medication covered by your insurance company, please allow [place] 1-2 business days to complete this process.  Drug prices often vary depending on where the prescription is filled and some pharmacies may offer cheaper prices.  The website www.goodrx.com contains coupons for medications through different pharmacies. The prices here do not account for what the cost  may be with help from insurance (it may be cheaper with your insurance), but the website can give you the price if you did not use any insurance.  - You can print the associated coupon and take it with your prescription to the pharmacy.  - You may also stop by our office during regular business hours and pick up a GoodRx coupon card.  - If you need your prescription sent electronically to a different pharmacy, notify our office through [facility] or by phone at [phone] option 4.     Si Usted Necesita Algo Despus de Su Visita  Tambin puede enviarnos un mensaje a travs de Clinical cytogeneticist. Por lo general respondemos a los mensajes de MyChart en el transcurso de 1 a 2 das hbiles.  Para renovar recetas, por favor pida a su farmacia que se ponga en contacto con nuestra oficina. Annie Sable de fax es [place] [phone].  Si tiene un asunto urgente cuando la clnica est cerrada y que no puede esperar hasta el siguiente da hbil, puede llamar/localizar a su doctor(a) al nmero que aparece a continuacin.   Por favor, tenga en cuenta que aunque hacemos todo lo posible para estar disponibles para asuntos urgentes fuera del horario de [place], no estamos disponibles las 24 horas del da, los 7 [address]  [address] de la [place].   Si tiene un problema urgente y no  puede comunicarse con nosotros, puede optar por buscar atencin mdica  en el consultorio de su doctor(a), en una clnica privada, en un centro de atencin urgente o en una sala de emergencias.  Si tiene Engineer, drilling, por favor llame inmediatamente al 911 o vaya a la sala de emergencias.  Nmeros de bper  - Dr. Gwen Pounds: [phone]  - Dra. Roseanne Reno: [phone]  - Dr. Katrinka Blazing: [phone]   En caso de inclemencias del tiempo, por favor llame a Lacy Duverney principal al [phone] para una actualizacin sobre el [place] de cualquier retraso o cierre.  Consejos para la medicacin en dermatologa: Por favor, guarde las cajas en las que vienen los medicamentos de uso tpico para ayudarle a seguir las instrucciones sobre dnde y cmo usarlos. Las farmacias generalmente imprimen las instrucciones del medicamento slo en las cajas y no directamente en los tubos del [place].   Si su medicamento es muy caro, por favor, pngase en contacto con Rolm Gala llamando al [phone] y presione la opcin 4 o envenos un mensaje a travs de Clinical cytogeneticist.   No podemos decirle cul ser su copago por los medicamentos por adelantado ya que esto es diferente dependiendo de la cobertura de su seguro. Sin embargo, es posible que podamos encontrar un medicamento sustituto a Audiological scientist un formulario para que el seguro cubra el medicamento que se considera necesario.   Si se requiere una autorizacin previa para que su compaa de seguros [place] su medicamento, por favor permtanos de 1 a 2 das hbiles para completar [address]  Los precios de los medicamentos varan con frecuencia dependiendo del Environmental consultant de dnde se surte la receta y alguna farmacias pueden ofrecer precios ms baratos.  El sitio web www.goodrx.com tiene cupones para medicamentos de Health and safety inspector. Los precios aqu no tienen en cuenta lo que podra costar con la ayuda del seguro (puede ser ms barato con su seguro),  pero el sitio web puede darle el precio si no utiliz Tourist information centre manager.  - Puede imprimir el cupn correspondiente y llevarlo con su receta a la farmacia.  - Tambin puede pasar por  nuestra oficina durante el horario de atencin regular y Education officer, museum una tarjeta de cupones de GoodRx.  - Si necesita que su receta se enve electrnicamente a una farmacia diferente, informe a nuestra oficina a travs de MyChart de [place] o por telfono llamando al [phone] y presione la opcin 4.

## 2024-02-19 ENCOUNTER — Inpatient Hospital Stay: Payer: 59 | Admitting: Internal Medicine

## 2024-02-19 ENCOUNTER — Telehealth: Payer: 59 | Admitting: Internal Medicine

## 2024-02-29 ENCOUNTER — Encounter: Payer: Self-pay | Admitting: Nurse Practitioner

## 2024-03-04 ENCOUNTER — Inpatient Hospital Stay: Attending: Internal Medicine | Admitting: Internal Medicine

## 2024-03-04 ENCOUNTER — Encounter: Payer: Self-pay | Admitting: Internal Medicine

## 2024-03-04 DIAGNOSIS — D473 Essential (hemorrhagic) thrombocythemia: Secondary | ICD-10-CM | POA: Diagnosis not present

## 2024-03-04 NOTE — Progress Notes (Signed)
 I connected with Jordan Jefferson on 03/04/24 at  3:15 PM EDT by video enabled telemedicine visit and verified that I am speaking with the correct person using two identifiers.  I discussed the limitations, risks, security and privacy concerns of performing an evaluation and management service by telemedicine and the availability of in-person appointments. I also discussed with the patient that there may be a patient responsible charge related to this service. The patient expressed understanding and agreed to proceed.    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    DIAGNOSIS: Essential thrombocytosis  STAGE:  Low risk       ;GOALS: control  CURRENT/MOST RECENT THERAPY: Anagrelide   Essential thrombocythemia (HCC)    Chief Complaint: ET    History of present illness:Jordan Jefferson 58 y.o.  male with history of ET on anagrelide is here for a follow up.  Patient clinical doing well.  Denies any blood clots.  Denies any shortness of breath or cough.  Observation/objective: Alert & oriented x 3. In No acute distress.   Assessment and plan: Essential thrombocythemia (HCC) # LOW RISK Essential thrombocytosis- CALR MUTATION POSITIVE- on anagrelide 1 milligram twice a day;  Continue baby asprin;   # MARCH 2025- for now continue Anagrelide. #[facility]-march 2025-reviewed.-Hemoglobin 14 platelets 370.  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 Louretta Shorten [facility] 03/04/2024 3:48 PM

## 2024-03-04 NOTE — Assessment & Plan Note (Addendum)
#   LOW RISK Essential thrombocytosis- CALR MUTATION POSITIVE- on anagrelide 1 milligram twice a day;  Continue baby asprin;   # MARCH 2025- for now continue Anagrelide. #[facility]-march 2025-reviewed.-Hemoglobin 14 platelets 370.  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

## 2024-03-14 ENCOUNTER — Other Ambulatory Visit: Payer: Self-pay

## 2024-03-14 DIAGNOSIS — L2089 Other atopic dermatitis: Secondary | ICD-10-CM

## 2024-03-14 MED ORDER — DUPIXENT 300 MG/2ML ~~LOC~~ SOAJ: 300.0000 mg | SUBCUTANEOUS | 5 refills | Status: DC

## 2024-03-14 NOTE — Progress Notes (Signed)
 [facility] left VM with request to RF prescription. aw

## 2024-03-15 ENCOUNTER — Other Ambulatory Visit: Payer: Self-pay | Admitting: Dermatology

## 2024-03-15 DIAGNOSIS — L2089 Other atopic dermatitis: Secondary | ICD-10-CM

## 2024-04-12 ENCOUNTER — Other Ambulatory Visit: Payer: Self-pay | Admitting: Dermatology

## 2024-04-12 DIAGNOSIS — L639 Alopecia areata, unspecified: Secondary | ICD-10-CM

## 2024-04-12 NOTE — Telephone Encounter (Signed)
 Patient also left nurse VM asking for 90 day supply due to cheaper cost through insurance. aw

## 2024-04-14 ENCOUNTER — Other Ambulatory Visit: Payer: Self-pay

## 2024-04-14 DIAGNOSIS — L639 Alopecia areata, unspecified: Secondary | ICD-10-CM

## 2024-04-14 MED ORDER — MINOXIDIL 2.5 MG PO TABS: 5.0000 mg | ORAL_TABLET | Freq: Every day | ORAL | 1 refills | Status: DC

## 2024-08-03 ENCOUNTER — Ambulatory Visit: Payer: 59 | Admitting: Dermatology

## 2024-08-12 ENCOUNTER — Ambulatory Visit: Payer: Self-pay

## 2024-08-12 DIAGNOSIS — Z1211 Encounter for screening for malignant neoplasm of colon: Secondary | ICD-10-CM | POA: Diagnosis present

## 2024-08-12 DIAGNOSIS — K5730 Diverticulosis of large intestine without perforation or abscess without bleeding: Secondary | ICD-10-CM | POA: Diagnosis not present

## 2024-08-12 DIAGNOSIS — Z800 Family history of malignant neoplasm of digestive organs: Secondary | ICD-10-CM | POA: Diagnosis not present

## 2024-08-14 ENCOUNTER — Other Ambulatory Visit: Payer: Self-pay | Admitting: Dermatology

## 2024-08-14 DIAGNOSIS — L2089 Other atopic dermatitis: Secondary | ICD-10-CM

## 2024-09-09 ENCOUNTER — Ambulatory Visit: Admitting: Internal Medicine

## 2024-10-04 ENCOUNTER — Encounter: Payer: Self-pay | Admitting: Internal Medicine

## 2024-10-04 ENCOUNTER — Ambulatory Visit: Admitting: Internal Medicine

## 2024-10-11 ENCOUNTER — Encounter: Payer: Self-pay | Admitting: Internal Medicine

## 2024-10-11 ENCOUNTER — Inpatient Hospital Stay: Attending: Internal Medicine | Admitting: Internal Medicine

## 2024-10-11 VITALS — BP 126/73 | HR 74 | Temp 97.4°F | Resp 16 | Ht 66.0 in | Wt 154.1 lb

## 2024-10-11 DIAGNOSIS — Z800 Family history of malignant neoplasm of digestive organs: Secondary | ICD-10-CM | POA: Insufficient documentation

## 2024-10-11 DIAGNOSIS — Z79899 Other long term (current) drug therapy: Secondary | ICD-10-CM | POA: Insufficient documentation

## 2024-10-11 DIAGNOSIS — D473 Essential (hemorrhagic) thrombocythemia: Secondary | ICD-10-CM | POA: Insufficient documentation

## 2024-10-11 DIAGNOSIS — Z8051 Family history of malignant neoplasm of kidney: Secondary | ICD-10-CM | POA: Diagnosis not present

## 2024-10-11 DIAGNOSIS — Z7982 Long term (current) use of aspirin: Secondary | ICD-10-CM | POA: Diagnosis not present

## 2024-10-11 MED ORDER — ANAGRELIDE HCL 1 MG PO CAPS: 1.0000 mg | ORAL_CAPSULE | Freq: Two times a day (BID) | ORAL | 1 refills | Status: AC

## 2024-10-11 NOTE — Progress Notes (Signed)
 No concerns today. Needs refill anagrelide , pended.

## 2024-10-11 NOTE — Progress Notes (Signed)
 [facility] OFFICE PROGRESS NOTE   Jordan Jefferson ORN, MD  Chief Complaint: ET   Oncology History Overview Note  # 2000- ESSENTIAL THROMBOCYTOSIS [incidental]- Jak 2 harland ] NEG on Anagrelide  4 mg/d & asprin 81mg /d; June 2017- STart hydrea ; DEC 2017- STOPPED sec to neutropenia; FEB 2018- BMBx- megakaryocytes; MPN; CALR MUTATION POSITIVE; Feb 2018- re-start anagrelide  1mg  BID.    DIAGNOSIS: Essential thrombocytosis  STAGE:  Low risk       ;GOALS: control  CURRENT/MOST RECENT THERAPY: Anagrelide    Essential thrombocythemia (HCC)   Interval History: Jordan Jefferson 58 y.o. male with history of  ET  on anagrelide , who returns to clinic for routine follow up.   Discussed the use of AI scribe software for clinical note transcription with the patient, who gave verbal consent to proceed.  History of Present Illness   Jordan Jefferson is a 58 year old male who presents for routine follow-up with his hematologist and oncologist.  Recent blood work shows a normal white blood cell count, hemoglobin at 12.9, slightly low neutrophil count, and a platelet count of 395. Blood sugar is 103, and kidney function tests are within normal range.  He is currently taking anaglide and aspirin.  His mother is 72 years old and has been experiencing some health issues. His father passed away a couple of years ago at the age of 17, having suffered from dementia, heart issues, and lung issues.  He plans to travel to [place] early next year but finds the [place] too far to travel.     He reports compliance with medication. Denies any interval blood clots. No pain or burning of fingers or toes. No nausea or vomiting. He feels well and denies complaints.   Review of Systems  Constitutional:  Negative for chills, fever, malaise/fatigue and weight loss.  HENT:  Negative for hearing loss, nosebleeds, sore throat and tinnitus.   Eyes:  Negative for blurred vision and double vision.   Respiratory:  Negative for cough, hemoptysis, shortness of breath and wheezing.   Cardiovascular:  Negative for chest pain, palpitations and leg swelling.  Gastrointestinal:  Negative for abdominal pain, blood in stool, constipation, diarrhea, melena, nausea and vomiting.  Genitourinary:  Negative for dysuria and urgency.  Musculoskeletal:  Negative for back pain, falls, joint pain and myalgias.  Skin:  Negative for itching and rash.  Neurological:  Negative for dizziness, tingling, sensory change, loss of consciousness, weakness and headaches.  Endo/Heme/Allergies:  Negative for environmental allergies. Does not bruise/bleed easily.  Psychiatric/Behavioral:  Negative for depression. The patient is not nervous/anxious and does not have insomnia.    Past Medical History:  Diagnosis Date   Essential thrombocytosis (HCC)    GERD (gastroesophageal reflux disease)    Helicobacter pylori ab+    resolved   Hypertension    IDA (iron deficiency anemia)    Neutropenia    Past Surgical History:  Procedure Laterality Date   COLONOSCOPY  03/2013   COLONOSCOPY WITH PROPOFOL  N/A 08/31/2018   Procedure: COLONOSCOPY WITH PROPOFOL ;  Surgeon: Toledo, Ladell POUR, MD;  Location: [facility] ENDOSCOPY;  Service: Gastroenterology;  Laterality: N/A;   EXCISION MASS NECK Right 09/06/2021   Procedure: EXCISION RIGHT POSTERIOR NECK MASS WITH NERVE STIMULATOR;  Surgeon: Herminio Miu, MD;  Location: [facility];  Service: ENT;  Laterality: Right;   HIATAL HERNIA REPAIR     UPPER GI ENDOSCOPY  11/2007   Family History  Problem Relation Age of Onset   Kidney cancer  Father 35       ? liver cancer   Colon cancer Mother 15   Social History   Socioeconomic History   Marital status: Married    Spouse name: Not on file   Number of children: Not on file   Years of education: Not on file   Highest education level: Not on file  Occupational History   Not on file  Tobacco Use   Smoking status: Never    Smokeless tobacco: Never  Vaping Use   Vaping status: Never Used  Substance and Sexual Activity   Alcohol use: No    Alcohol/week: 0.0 standard drinks of alcohol    Comment: rarely   Drug use: No   Sexual activity: Yes    Partners: Female  Other Topics Concern   Not on file  Social History Narrative   Not on file   Social Drivers of Health   Financial Resource Strain: Low Risk  (04/20/2024)   Received from [facility]   Overall Financial Resource Strain (CARDIA)    Difficulty of Paying Living Expenses: Not hard at all  Food Insecurity: No Food Insecurity (04/20/2024)   Received from [facility]   Hunger Vital Sign    Within the past 12 months, you worried that your food would run out before you got the money to buy more.: Never true    Within the past 12 months, the food you bought just didn't last and you didn't have money to get more.: Never true  Transportation Needs: No Transportation Needs (04/20/2024)   Received from [facility] - Transportation    In the past 12 months, has lack of transportation kept you from medical appointments or from getting medications?: No    Lack of Transportation (Non-Medical): No  Physical Activity: Not on file  Stress: Not on file  Social Connections: Not on file   No Known Allergies  Current Outpatient Medications on File Prior to Visit  Medication Sig Dispense Refill   amLODipine (NORVASC) 5 MG tablet Take 5 mg by mouth daily.     aspirin EC 81 MG tablet Take 1 tablet by mouth daily.     clobetasol  (TEMOVATE ) 0.05 % external solution Apply topically twice daily to affected areas of scalp. Avoid applying to face, groin, and axilla. Use as directed. 50 mL 2   Dupilumab  (DUPIXENT ) 300 MG/2ML SOAJ Inject 300 mg into the skin every 14 (fourteen) days. Starting at day 15 for maintenance. 4 mL 5   fexofenadine (ALLEGRA) 180 MG tablet Take 180 mg by mouth daily.      minoxidil   (LONITEN ) 2.5 MG tablet Take 2 tablets (5 mg total) by mouth daily. 180 tablet 1   pantoprazole (PROTONIX) 40 MG tablet Take by mouth.     Ruxolitinib Phosphate  (OPZELURA ) 1.5 % CREA Apply 1 application  topically daily. 60 g 6   mometasone  (ELOCON ) 0.1 % cream Apply topically to affected areas of face twice 5 days weekly m - f. (Patient not taking: Reported on 10/11/2024) 45 g 1   promethazine-dextromethorphan (PROMETHAZINE-DM) 6.25-15 MG/5ML syrup Take 5 mLs by mouth. (Patient not taking: Reported on 10/11/2024)     No current facility-administered medications on file prior to visit.   Physical Examination: Ecog: 0  Today's Vitals   10/11/24 1516 10/11/24 1528  BP: (!) 144/75 126/73  Pulse: 74   Resp: 16   Temp: (!) 97.4 F (36.3 C)   TempSrc: Tympanic  SpO2: 99%   Weight: 154 lb 1.6 oz (69.9 kg)   Height: 5' 6 (1.676 m)   PainSc: 2    PainLoc: Back    Body mass index is 24.87 kg/m.  Physical Exam Constitutional:      Appearance: He is not ill-appearing.  Eyes:     General: No scleral icterus. Pulmonary:     Effort: No respiratory distress.  Abdominal:     General: There is no distension.     Tenderness: There is no abdominal tenderness.  Musculoskeletal:        General: No deformity.  Skin:    General: Skin is dry.     Coloration: Skin is not pale.     Findings: No bruising, erythema or lesion.  Neurological:     Mental Status: He is alert and oriented to person, place, and time.  Psychiatric:        Mood and Affect: Mood normal.        Behavior: Behavior normal.        Assessment and plan: Essential thrombocythemia (HCC) # LOW RISK Essential thrombocytosis- CALR MUTATION POSITIVE- on anagrelide  1 milligram twice a day;  Continue baby asprin;   # MARCH 2025- for now continue Anagrelide . 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

## 2024-10-11 NOTE — Assessment & Plan Note (Signed)
#   LOW RISK Essential thrombocytosis- CALR MUTATION POSITIVE- on anagrelide  1 milligram twice a day;  Continue baby asprin;   # MARCH 2025- for now continue Anagrelide . 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

## 2024-11-01 ENCOUNTER — Other Ambulatory Visit: Payer: Self-pay | Admitting: Dermatology

## 2024-11-01 DIAGNOSIS — L2089 Other atopic dermatitis: Secondary | ICD-10-CM

## 2024-11-22 ENCOUNTER — Encounter: Payer: Self-pay | Admitting: Dermatology

## 2024-11-22 ENCOUNTER — Ambulatory Visit (INDEPENDENT_AMBULATORY_CARE_PROVIDER_SITE_OTHER): Payer: Self-pay | Admitting: Dermatology

## 2024-11-22 DIAGNOSIS — L639 Alopecia areata, unspecified: Secondary | ICD-10-CM | POA: Diagnosis not present

## 2024-11-22 DIAGNOSIS — L2089 Other atopic dermatitis: Secondary | ICD-10-CM

## 2024-11-22 DIAGNOSIS — Z7189 Other specified counseling: Secondary | ICD-10-CM

## 2024-11-22 DIAGNOSIS — Z79899 Other long term (current) drug therapy: Secondary | ICD-10-CM

## 2024-11-22 MED ORDER — OPZELURA 1.5 % EX CREA: 1.0000 "application " | TOPICAL_CREAM | Freq: Every day | CUTANEOUS | 6 refills | Status: AC

## 2024-11-22 NOTE — Progress Notes (Signed)
 "  Follow-Up Visit   Subjective  Jordan Jefferson is a 58 y.o. male who presents for the following: follow up on atopic dermatitis and alopecia areata   Hx of flares at thighs for atopic dermatitis - feels has improved while on dupixent  and opzelura   Also feels alopecia has improved, was started on minoxidil  but reports he has not been taking. Has used clobetasol  solution to scalp   The following portions of the chart were reviewed this encounter and updated as appropriate: medications, allergies, medical history  Review of Systems:  No other skin or systemic complaints except as noted in HPI or Assessment and Plan.  Objective  Well appearing patient in no apparent distress; mood and affect are within normal limits.   A focused examination was performed of the following areas: Scalp , b/l thigh   Relevant exam findings are noted in the Assessment and Plan.    Assessment & Plan    ATOPIC DERMATITIS Improved on Dupixent   Exam: Spotty lichenification  and  hyperpigmentation on thighs b/l  3% BSA  Started Dupixent  10/2023 Hx tried and failed Adbry   Chronic and persistent condition with duration or expected duration over one year. Condition is improving with treatment but not currently at goal.   Atopic dermatitis (eczema) is a chronic, relapsing, pruritic condition that can significantly affect quality of life. It is often associated with allergic rhinitis and/or asthma and can require treatment with topical medications, phototherapy, or in severe cases biologic injectable medication (Dupixent ; Adbry ) or Oral JAK inhibitors.   Treatment Plan: Continue Opzelura  cream bid to itchy areas Continue Allegra taking by mouth daily. Could take twice daily if itchy or could start Zyrtec or Xyzal by mouth at bedtime Continue Dupixent  injections q2 weeks   Recommend gentle skin care.     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 Chronic and persistent condition with duration or expected duration over one year. Condition is symptomatic/ bothersome to patient. Not currently at goal. 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  Male pattern alopecia is mediated by dihydrotestosterone which induces miniaturization of androgen-sensitive hair follicles.  It is chronic and persistent, but treatable; not curable. Topical treatment includes: - 5% topical Minoxidil  Oral treatment includes: - Finasteride 1 mg qd - Minoxidil  1.25 - 5 mg qd - Dutasteride 0.5 mg qd Adjunct therapy includes: - Low Level Laser Light Therapy (LLLT) - Platelet-rich Plasma injections (PRP) - Hair Transplantation or scalp reduction   Treatment Plan:  Reviewed previous labs 11/15/2024 BMP, A1C, Lipid Panel  PSA - ok   Ordered thyroid test/TSH today.  OTHER ATOPIC DERMATITIS   This Visit - Ruxolitinib Phosphate  (OPZELURA ) 1.5 % CREA - Apply 1 application  topically daily. Existing Treatments - DUPIXENT  300 MG/2ML SOAJ - INJECT 1 PEN UNDER THE SKIN EVERY 14 DAYS ALOPECIA AREATA   This Visit - TSH Existing Treatments - clobetasol  (TEMOVATE ) 0.05 % external solution - Apply topically twice daily to affected areas of scalp. Avoid applying to face, groin, and axilla. Use as directed. - mometasone  (ELOCON ) 0.1 % cream - Apply topically to affected areas of face twice 5 days weekly m - f. - minoxidil  (LONITEN ) 2.5 MG tablet - Take 2 tablets (5 mg total) by mouth daily.  No follow-ups on file.  IEleanor Blush, CMA, am acting as scribe for Alm Rhyme, MD.  Documentation: I have reviewed the above documentation for accuracy and completeness, and I agree with the above.  Alm Rhyme, MD    "

## 2024-11-23 ENCOUNTER — Other Ambulatory Visit: Payer: Self-pay | Admitting: Dermatology

## 2024-11-23 ENCOUNTER — Ambulatory Visit: Payer: Self-pay | Admitting: Dermatology

## 2024-11-23 DIAGNOSIS — L2089 Other atopic dermatitis: Secondary | ICD-10-CM

## 2024-11-23 LAB — TSH: TSH: 2.08 u[IU]/mL (ref 0.450–4.500)

## 2024-11-28 NOTE — Telephone Encounter (Addendum)
 Tried calling patient regarding results. No answer and unable to leave message. Will try again later.  ----- Message from Alm Rhyme, MD sent at 11/23/2024 12:33 PM EST ----- Thyroid test = TSH from 11/22/2024 was normal.  Alopecia areata  (hair loss) does not appear to be related to thyroid. Pts hair has re-grown.  Will check next visit

## 2024-11-28 NOTE — Telephone Encounter (Addendum)
 Called patient again regarding results. Discussed results with patient. He verbalized understanding. Will recheck at 6 months follow up. Patient denied questions.   ----- Message from Alm Rhyme, MD sent at 11/23/2024 12:33 PM EST ----- Thyroid test = TSH from 11/22/2024 was normal.  Alopecia areata  (hair loss) does not appear to be related to thyroid. Pts hair has re-grown.  Will check next visit

## 2024-12-11 ENCOUNTER — Other Ambulatory Visit: Payer: Self-pay | Admitting: Dermatology

## 2024-12-11 DIAGNOSIS — L639 Alopecia areata, unspecified: Secondary | ICD-10-CM

## 2025-04-10 ENCOUNTER — Inpatient Hospital Stay: Admitting: Internal Medicine

## 2025-05-24 ENCOUNTER — Ambulatory Visit: Admitting: Dermatology
